# Patient Record
Sex: Male | Born: 1950 | Race: White | Hispanic: No | Smoking: Former smoker
Health system: Southern US, Community
[De-identification: ages and names within clinical notes are randomized; demographics above are authoritative.]

## PROBLEM LIST (undated history)

## (undated) DIAGNOSIS — I10 Essential (primary) hypertension: Secondary | ICD-10-CM

## (undated) DIAGNOSIS — J449 Chronic obstructive pulmonary disease, unspecified: Secondary | ICD-10-CM

## (undated) DIAGNOSIS — I2510 Atherosclerotic heart disease of native coronary artery without angina pectoris: Secondary | ICD-10-CM

## (undated) DIAGNOSIS — J45909 Unspecified asthma, uncomplicated: Secondary | ICD-10-CM

---

## 2017-07-13 ENCOUNTER — Encounter: Payer: Self-pay | Admitting: Emergency Medicine

## 2017-07-13 ENCOUNTER — Emergency Department: Payer: Medicare Other

## 2017-07-13 ENCOUNTER — Inpatient Hospital Stay
Admission: EM | Admit: 2017-07-13 | Discharge: 2017-07-16 | DRG: 194 | Disposition: A | Discharge status: Home / Self Care | Payer: Medicare Other | Attending: Internal Medicine | Admitting: Internal Medicine

## 2017-07-13 ENCOUNTER — Other Ambulatory Visit: Payer: Self-pay

## 2017-07-13 DIAGNOSIS — I2510 Atherosclerotic heart disease of native coronary artery without angina pectoris: Secondary | ICD-10-CM | POA: Diagnosis present

## 2017-07-13 DIAGNOSIS — R0602 Shortness of breath: Secondary | ICD-10-CM

## 2017-07-13 DIAGNOSIS — Z9981 Dependence on supplemental oxygen: Secondary | ICD-10-CM | POA: Diagnosis not present

## 2017-07-13 DIAGNOSIS — Z79899 Other long term (current) drug therapy: Secondary | ICD-10-CM | POA: Diagnosis not present

## 2017-07-13 DIAGNOSIS — R0603 Acute respiratory distress: Secondary | ICD-10-CM

## 2017-07-13 DIAGNOSIS — Z7982 Long term (current) use of aspirin: Secondary | ICD-10-CM

## 2017-07-13 DIAGNOSIS — Z87891 Personal history of nicotine dependence: Secondary | ICD-10-CM | POA: Diagnosis not present

## 2017-07-13 DIAGNOSIS — J441 Chronic obstructive pulmonary disease with (acute) exacerbation: Secondary | ICD-10-CM | POA: Diagnosis not present

## 2017-07-13 DIAGNOSIS — E782 Mixed hyperlipidemia: Secondary | ICD-10-CM | POA: Diagnosis present

## 2017-07-13 DIAGNOSIS — J189 Pneumonia, unspecified organism: Secondary | ICD-10-CM

## 2017-07-13 DIAGNOSIS — I252 Old myocardial infarction: Secondary | ICD-10-CM | POA: Diagnosis not present

## 2017-07-13 DIAGNOSIS — R0789 Other chest pain: Secondary | ICD-10-CM

## 2017-07-13 DIAGNOSIS — J101 Influenza due to other identified influenza virus with other respiratory manifestations: Secondary | ICD-10-CM | POA: Diagnosis present

## 2017-07-13 DIAGNOSIS — J111 Influenza due to unidentified influenza virus with other respiratory manifestations: Secondary | ICD-10-CM | POA: Diagnosis not present

## 2017-07-13 DIAGNOSIS — I10 Essential (primary) hypertension: Secondary | ICD-10-CM | POA: Diagnosis present

## 2017-07-13 DIAGNOSIS — Z880 Allergy status to penicillin: Secondary | ICD-10-CM | POA: Diagnosis not present

## 2017-07-13 DIAGNOSIS — Z9861 Coronary angioplasty status: Secondary | ICD-10-CM | POA: Diagnosis not present

## 2017-07-13 DIAGNOSIS — J181 Lobar pneumonia, unspecified organism: Secondary | ICD-10-CM

## 2017-07-13 HISTORY — DX: Essential (primary) hypertension: I10

## 2017-07-13 HISTORY — DX: Unspecified asthma, uncomplicated: J45.909

## 2017-07-13 HISTORY — DX: Chronic obstructive pulmonary disease, unspecified: J44.9

## 2017-07-13 HISTORY — DX: Atherosclerotic heart disease of native coronary artery without angina pectoris: I25.10

## 2017-07-13 LAB — COMPREHENSIVE METABOLIC PANEL
ALBUMIN: 4.2 g/dL (ref 3.5–5.0)
ALT: 28 U/L (ref 17–63)
AST: 32 U/L (ref 15–41)
Alkaline Phosphatase: 89 U/L (ref 38–126)
Anion gap: 12 (ref 5–15)
BUN: 13 mg/dL (ref 6–20)
CHLORIDE: 105 mmol/L (ref 101–111)
CO2: 21 mmol/L — AB (ref 22–32)
CREATININE: 0.92 mg/dL (ref 0.61–1.24)
Calcium: 10.1 mg/dL (ref 8.9–10.3)
GFR calc Af Amer: >60 mL/min (ref >60)
GFR calc non Af Amer: >60 mL/min (ref >60)
GLUCOSE: 121 mg/dL — AB (ref 65–99)
Potassium: 3.9 mmol/L (ref 3.5–5.1)
SODIUM: 138 mmol/L (ref 135–145)
Total Bilirubin: 0.9 mg/dL (ref 0.3–1.2)
Total Protein: 7.4 g/dL (ref 6.5–8.1)

## 2017-07-13 LAB — CBC WITH DIFFERENTIAL/PLATELET
Basophils Absolute: 0.1 10*3/uL (ref 0–0.1)
Basophils Relative: 1 %
Eosinophils Absolute: 0 10*3/uL (ref 0–0.7)
Eosinophils Relative: 0 %
HEMATOCRIT: 40.1 % (ref 40.0–52.0)
HEMOGLOBIN: 12.9 g/dL — AB (ref 13.0–18.0)
LYMPHS PCT: 22 %
Lymphs Abs: 3.2 10*3/uL (ref 1.0–3.6)
MCH: 25.2 pg — ABNORMAL LOW (ref 26.0–34.0)
MCHC: 32.1 g/dL (ref 32.0–36.0)
MCV: 78.6 fL — AB (ref 80.0–100.0)
MONO ABS: 0.7 10*3/uL (ref 0.2–1.0)
MONOS PCT: 5 %
NEUTROS ABS: 10.4 10*3/uL — AB (ref 1.4–6.5)
NEUTROS PCT: 72 %
Platelets: 285 10*3/uL (ref 150–440)
RBC: 5.1 MIL/uL (ref 4.40–5.90)
RDW: 17.7 % — AB (ref 11.5–14.5)
WBC: 14.4 10*3/uL — ABNORMAL HIGH (ref 3.8–10.6)

## 2017-07-13 LAB — TROPONIN I
Troponin I: <0.03 ng/mL (ref <0.03)
Troponin I: <0.03 ng/mL (ref <0.03)

## 2017-07-13 LAB — FIBRIN DERIVATIVES D-DIMER (ARMC ONLY): FIBRIN DERIVATIVES D-DIMER (ARMC): 704.42 ng{FEU}/mL — AB (ref 0.00–499.00)

## 2017-07-13 LAB — INFLUENZA PANEL BY PCR (TYPE A & B)
Influenza A By PCR: POSITIVE — AB
Influenza B By PCR: NEGATIVE

## 2017-07-13 LAB — BRAIN NATRIURETIC PEPTIDE: B Natriuretic Peptide: 79 pg/mL (ref 0.0–100.0)

## 2017-07-13 MED ORDER — DOCUSATE SODIUM 100 MG PO CAPS
100.0000 mg | ORAL_CAPSULE | Freq: Two times a day (BID) | ORAL | Status: DC
  Administered 2017-07-14 – 2017-07-16 (×5): 100 mg via ORAL
  Filled 2017-07-13 (×6): qty 1

## 2017-07-13 MED ORDER — ONDANSETRON HCL 4 MG PO TABS: 4.0000 mg | ORAL_TABLET | Freq: Four times a day (QID) | ORAL | Status: DC | PRN

## 2017-07-13 MED ORDER — METHYLPREDNISOLONE SODIUM SUCC 125 MG IJ SOLR
125.0000 mg | Freq: Once | INTRAMUSCULAR | Status: AC
  Administered 2017-07-13: 125 mg via INTRAVENOUS
  Filled 2017-07-13: qty 2

## 2017-07-13 MED ORDER — OSELTAMIVIR PHOSPHATE 75 MG PO CAPS
75.0000 mg | ORAL_CAPSULE | Freq: Once | ORAL | Status: AC
  Administered 2017-07-13: 75 mg via ORAL
  Filled 2017-07-13: qty 1

## 2017-07-13 MED ORDER — DILTIAZEM HCL 30 MG PO TABS
30.0000 mg | ORAL_TABLET | Freq: Two times a day (BID) | ORAL | Status: DC
  Administered 2017-07-13 – 2017-07-16 (×6): 30 mg via ORAL
  Filled 2017-07-13 (×6): qty 1

## 2017-07-13 MED ORDER — IOPAMIDOL (ISOVUE-370) INJECTION 76%
75.0000 mL | Freq: Once | INTRAVENOUS | Status: AC | PRN
Start: 2017-07-13 — End: 2017-07-13
  Administered 2017-07-13: 75 mL via INTRAVENOUS

## 2017-07-13 MED ORDER — METHYLPREDNISOLONE SODIUM SUCC 125 MG IJ SOLR
60.0000 mg | Freq: Four times a day (QID) | INTRAMUSCULAR | Status: DC
  Administered 2017-07-13 – 2017-07-14 (×4): 60 mg via INTRAVENOUS
  Filled 2017-07-13 (×4): qty 2

## 2017-07-13 MED ORDER — BISACODYL 10 MG RE SUPP: 10.0000 mg | Freq: Every day | RECTAL | Status: DC | PRN

## 2017-07-13 MED ORDER — ACETAMINOPHEN 325 MG PO TABS: 650.0000 mg | ORAL_TABLET | Freq: Four times a day (QID) | ORAL | Status: DC | PRN

## 2017-07-13 MED ORDER — ONDANSETRON HCL 4 MG/2ML IJ SOLN: 4.0000 mg | Freq: Four times a day (QID) | INTRAMUSCULAR | Status: DC | PRN

## 2017-07-13 MED ORDER — PANTOPRAZOLE SODIUM 40 MG PO TBEC
40.0000 mg | DELAYED_RELEASE_TABLET | Freq: Every day | ORAL | Status: DC
  Administered 2017-07-13 – 2017-07-16 (×4): 40 mg via ORAL
  Filled 2017-07-13 (×4): qty 1

## 2017-07-13 MED ORDER — IPRATROPIUM-ALBUTEROL 0.5-2.5 (3) MG/3ML IN SOLN
3.0000 mL | Freq: Once | RESPIRATORY_TRACT | Status: AC
  Administered 2017-07-13: 3 mL via RESPIRATORY_TRACT
  Filled 2017-07-13: qty 3

## 2017-07-13 MED ORDER — ISOSORBIDE MONONITRATE ER 30 MG PO TB24
30.0000 mg | ORAL_TABLET | Freq: Every day | ORAL | Status: DC
  Administered 2017-07-13 – 2017-07-16 (×4): 30 mg via ORAL
  Filled 2017-07-13 (×4): qty 1

## 2017-07-13 MED ORDER — LEVOFLOXACIN IN D5W 750 MG/150ML IV SOLN
750.0000 mg | INTRAVENOUS | Status: DC
  Administered 2017-07-14: 750 mg via INTRAVENOUS
  Filled 2017-07-13: qty 150

## 2017-07-13 MED ORDER — NITROGLYCERIN 0.4 MG SL SUBL
0.4000 mg | SUBLINGUAL_TABLET | SUBLINGUAL | Status: DC | PRN
  Administered 2017-07-14 – 2017-07-15 (×3): 0.4 mg via SUBLINGUAL
  Filled 2017-07-13 (×4): qty 1

## 2017-07-13 MED ORDER — ASPIRIN EC 325 MG PO TBEC
325.0000 mg | DELAYED_RELEASE_TABLET | Freq: Every day | ORAL | Status: DC
  Administered 2017-07-13 – 2017-07-16 (×4): 325 mg via ORAL
  Filled 2017-07-13 (×4): qty 1

## 2017-07-13 MED ORDER — LISINOPRIL 5 MG PO TABS
2.5000 mg | ORAL_TABLET | Freq: Every day | ORAL | Status: DC
  Administered 2017-07-13 – 2017-07-14 (×2): 2.5 mg via ORAL
  Filled 2017-07-13 (×2): qty 0.5

## 2017-07-13 MED ORDER — LEVOFLOXACIN IN D5W 750 MG/150ML IV SOLN
750.0000 mg | Freq: Once | INTRAVENOUS | Status: AC
  Administered 2017-07-13: 750 mg via INTRAVENOUS
  Filled 2017-07-13: qty 150

## 2017-07-13 MED ORDER — ACETAMINOPHEN 650 MG RE SUPP: 650.0000 mg | Freq: Four times a day (QID) | RECTAL | Status: DC | PRN

## 2017-07-13 MED ORDER — SODIUM CHLORIDE 0.9 % IV SOLN
INTRAVENOUS | Status: DC
  Administered 2017-07-13 – 2017-07-15 (×6): via INTRAVENOUS

## 2017-07-13 MED ORDER — METHYLPREDNISOLONE SODIUM SUCC 125 MG IJ SOLR: 125.0000 mg | Freq: Once | INTRAMUSCULAR | Status: DC

## 2017-07-13 MED ORDER — ATORVASTATIN CALCIUM 10 MG PO TABS
10.0000 mg | ORAL_TABLET | Freq: Every day | ORAL | Status: DC
  Administered 2017-07-13 – 2017-07-16 (×4): 10 mg via ORAL
  Filled 2017-07-13 (×4): qty 1

## 2017-07-13 MED ORDER — IPRATROPIUM-ALBUTEROL 0.5-2.5 (3) MG/3ML IN SOLN
3.0000 mL | Freq: Four times a day (QID) | RESPIRATORY_TRACT | Status: DC
  Administered 2017-07-13 – 2017-07-14 (×6): 3 mL via RESPIRATORY_TRACT
  Filled 2017-07-13 (×5): qty 3

## 2017-07-13 MED ORDER — IPRATROPIUM-ALBUTEROL 0.5-2.5 (3) MG/3ML IN SOLN
3.0000 mL | Freq: Once | RESPIRATORY_TRACT | Status: AC
Start: 2017-07-13 — End: 2017-07-13
  Administered 2017-07-13: 3 mL via RESPIRATORY_TRACT
  Filled 2017-07-13: qty 3

## 2017-07-13 MED ORDER — OSELTAMIVIR PHOSPHATE 75 MG PO CAPS
75.0000 mg | ORAL_CAPSULE | Freq: Two times a day (BID) | ORAL | Status: DC
  Administered 2017-07-13 – 2017-07-16 (×7): 75 mg via ORAL
  Filled 2017-07-13 (×7): qty 1

## 2017-07-13 MED ORDER — ENOXAPARIN SODIUM 40 MG/0.4ML ~~LOC~~ SOLN
40.0000 mg | SUBCUTANEOUS | Status: DC
  Administered 2017-07-13 – 2017-07-15 (×3): 40 mg via SUBCUTANEOUS
  Filled 2017-07-13 (×3): qty 0.4

## 2017-07-13 NOTE — H&P (Signed)
History and Physical    Rosita KeaDale Brag ZOX:[id number] DOB: 02/18/1951 DOA: 07/13/2017  Referring physician: Dr. Darnelle CatalanMalinda PCP: System, Pcp Not In  Specialists: none  Chief Complaint: SOB and chest pain  HPI: Rosita KeaDale Brag is a 67 y.o. male has a past medical history significant for HTN, COPD, and CAD with nocturnal O2 at home now with left-sided chest and rib pain with cough and SOB. Influenza A positive in ER. Chest CT negative for pneumonia or PE. Hypoxic on RA despite IV steroids and SVN's x 3. Still with left-sided CP. He is now admitted. Has had fevers at home. No N/V/D.  Review of Systems: The patient denies anorexia, weight loss,, vision loss, decreased hearing, hoarseness, syncope,  peripheral edema, balance deficits, hemoptysis, abdominal pain, melena, hematochezia, severe indigestion/heartburn, hematuria, incontinence, genital sores, muscle weakness, suspicious skin lesions, transient blindness, difficulty walking, depression, unusual weight change, abnormal bleeding, enlarged lymph nodes, angioedema, and breast masses.   Past Medical History:  Diagnosis Date  . Asthma   . COPD (chronic obstructive pulmonary disease) (HCC)   . Coronary artery disease   . Hypertension    History reviewed. No pertinent surgical history. Social History:  reports that he quit smoking about 9 years ago. he has never used smokeless tobacco. He reports that he does not drink alcohol or use drugs.  Allergies  Allergen Reactions  . Penicillins Hives    Has patient had a PCN reaction causing immediate rash, facial/tongue/throat swelling, SOB or lightheadedness with hypotension: Unknown Has patient had a PCN reaction causing severe rash involving mucus membranes or skin necrosis: Unknown Has patient had a PCN reaction that required hospitalization: Unknown Has patient had a PCN reaction occurring within the last 10 years: Yes If all of the above answers are "NO", then may proceed with Cephalosporin use.     History reviewed. No pertinent family history.  Prior to Admission medications   Medication Sig Start Date End Date Taking? Authorizing Provider  aspirin 325 MG tablet Take 325 mg by mouth daily.   Yes [provider]  atorvastatin (LIPITOR) 10 MG tablet Take 10 mg by mouth daily.   Yes [provider]  diltiazem (CARDIZEM) 30 MG tablet Take 30 mg by mouth 2 (two) times daily.   Yes [provider]  isosorbide mononitrate (IMDUR) 30 MG 24 hr tablet Take 30 mg by mouth daily.   Yes [provider]  lisinopril (PRINIVIL,ZESTRIL) 2.5 MG tablet Take 2.5 mg by mouth daily.   Yes [provider]  nitroGLYCERIN (NITROSTAT) 0.4 MG SL tablet Place 0.4 mg under the tongue every 5 (five) minutes as needed for chest pain.   Yes [provider]  pantoprazole (PROTONIX) 40 MG tablet Take 40 mg by mouth daily.   Yes [provider]   Physical Exam: Vitals:   07/13/17 1130 07/13/17 1200 07/13/17 1230 07/13/17 1300  BP: 128/73 131/76 139/78 (!) 146/94  Pulse: 82 85 80 77  Resp: 20 (!) 23 19 15   Temp:      TempSrc:      SpO2: 96% 97% 99% 98%  Weight:      Height:         General:  No apparent distress, WDWN, Barrett/AT  Eyes: PERRL, EOMI, no scleral icterus, conjunctiva clear  ENT: moist oropharynx without exudate, TM's benign, dentition fair  Neck: supple, no lymphadenopathy. No bruits or thyromegaly  Cardiovascular: regular rate without MRG; 2+ peripheral pulses, no JVD, no peripheral edema  Respiratory: diffuse rhonchi  without wheezes or rales. No dullness. Respiratory effort increased  Abdomen: soft, non tender to palpation, positive bowel sounds, no guarding, no rebound  Skin: no rashes or lesions  Musculoskeletal: normal bulk and tone, no joint swelling  Psychiatric: normal mood and affect, A&OX3  Neurologic: CN 2-12 grossly intact, Motor strength 5/5 in all 4 groups with symmetric DTR's and non-focal sensory  exam  Labs on Admission:  Basic Metabolic Panel: Recent Labs  Lab 07/13/17 0859  NA 138  K 3.9  CL 105  CO2 21*  GLUCOSE 121*  BUN 13  CREATININE 0.92  CALCIUM 10.1   Liver Function Tests: Recent Labs  Lab 07/13/17 0859  AST 32  ALT 28  ALKPHOS 89  BILITOT 0.9  PROT 7.4  ALBUMIN 4.2   No results for input(s): LIPASE, AMYLASE in the last 168 hours. No results for input(s): AMMONIA in the last 168 hours. CBC: Recent Labs  Lab 07/13/17 0859  WBC 14.4*  NEUTROABS 10.4*  HGB 12.9*  HCT 40.1  MCV 78.6*  PLT 285   Cardiac Enzymes: Recent Labs  Lab 07/13/17 0859  TROPONINI <0.03    BNP (last 3 results) Recent Labs    07/13/17 0859  BNP 79.0    ProBNP (last 3 results) No results for input(s): PROBNP in the last 8760 hours.  CBG: No results for input(s): GLUCAP in the last 168 hours.  Radiological Exams on Admission: Ct Angio Chest Pe W And/or Wo Contrast  Result Date: 07/13/2017 CLINICAL DATA:  Left-sided chest pain and shortness of breath. EXAM: CT ANGIOGRAPHY CHEST WITH CONTRAST TECHNIQUE: Multidetector CT imaging of the chest was performed using the standard protocol during bolus administration of intravenous contrast. Multiplanar CT image reconstructions and MIPs were obtained to evaluate the vascular anatomy. CONTRAST:  75mL ISOVUE-370 IOPAMIDOL (ISOVUE-370) INJECTION 76% COMPARISON:  07/13/2017 FINDINGS: Cardiovascular: The heart size appears normal. Aortic atherosclerosis. Calcification within the LAD and left circumflex coronary arteries noted. No pericardial effusion. The main pulmonary artery appears patent. No saddle embolus or central obstructing emboli identified. No lobar or segmental pulmonary artery filling defects identified. Mediastinum/Nodes: Normal appearance of the thyroid gland. The trachea appears patent and is midline. Normal appearance of the esophagus. No axillary or supraclavicular adenopathy. Mild mediastinal adenopathy and hilar  adenopathy identified. AP window lymph node measures 11 mm, image 41 of series 4. Subcarinal lymph node measures 1.4 cm. Left hilar lymph node measures 0.9 cm. Lungs/Pleura: No pleural effusion identified. Advanced changes of emphysema identified. Mild pneumonitis within the posterior and medial left lower lobe. No airspace consolidation or atelectasis identified. No pneumothorax. Within the left upper lobe there is a small nodule with spiculated margins measuring 8 mm, image 39 of series 9. Left lung perifissural nodule likely represents an intrapulmonary lymph node Upper Abdomen: No acute abnormality. Small stone identified within the dependent portion of the gallbladder. Musculoskeletal: No aggressive lytic or sclerotic bone lesions. Review of the MIP images confirms the above findings. IMPRESSION: 1. No evidence for acute pulmonary embolus. 2. Left upper lobe pulmonary nodule measures 8 mm. Non-contrast chest CT at 6-12 months is recommended. If the nodule is stable at time of repeat CT, then future CT at 18-24 months (from today's scan) is considered optional for low-risk patients, but is recommended for high-risk patients. This recommendation follows the consensus statement: Guidelines for Management of Incidental Pulmonary Nodules Detected on CT Images:From the Fleischner Society 2017; published online before print (10.1148/[id number]). 3. Mild mediastinal and hilar adenopathy identified. 4. Aortic  Atherosclerosis (ICD10-I70.0) and Emphysema (ICD10-J43.9). 5. Two vessel coronary artery atherosclerotic calcifications. Electronically Signed   By: Signa Kell M.D.   On: 07/13/2017 12:10   Dg Chest Portable 1 View  Result Date: 07/13/2017 CLINICAL DATA:  Chest pain and cough EXAM: PORTABLE CHEST 1 VIEW COMPARISON:  None. FINDINGS: 0919 hours. The lungs are clear without focal pneumonia, edema, pneumothorax or pleural effusion. Small pulmonary nodule identified left upper lobe. Interstitial markings  are diffusely coarsened with chronic features. The cardio pericardial silhouette is enlarged. The visualized bony structures of the thorax are intact. Telemetry leads overlie the chest. IMPRESSION: 1. Left upper lobe pulmonary nodule. CT chest without contrast recommended to further evaluate. 2. Cardiomegaly with chronic interstitial changes. Electronically Signed   By: Kennith Center M.D.   On: 07/13/2017 09:35    EKG: Independently reviewed.  Assessment/Plan Principal Problem:   COPD exacerbation (HCC) Active Problems:   Influenza A   Acute respiratory distress   Chest pain, atypical   Will admit to floor with IV fluids, IV ABX, Tamiflu, O2, and SVN's. Echo ordered. Follow enzymes. Consult Pulmonology and Cardiology. Repeat labs in AM. CM consult for D/c planning  Diet: low salt Fluids: NS@75  DVT Prophylaxis: Lovenox  Code Status: FULL  Family Communication: none  Disposition Plan: home  Time spent: 50 min

## 2017-07-13 NOTE — ED Notes (Signed)
This tech walked pt in front of nurses station 25 ft pt o2 sat 95 % on 2 lts of O2; pt began to wheeze and needed a stop break; heart rate did raise from 88 bpm to 102 bpm the more he walked

## 2017-07-13 NOTE — ED Provider Notes (Addendum)
[facility] Emergency Department Provider Note   ____________________________________________   First MD Initiated Contact with Patient 07/13/17 [phone]     (approximate)  I have reviewed the triage vital signs and the nursing notes.   HISTORY  Chief Complaint Chest Pain; Shortness of Breath; and Cough    HPI Stephen Brady is a 67 y.o. male Patient reports 2 days of cough productive of white or black phlegm, shortness of breath, aching all over and some chest pain especially in the left lower chest degrees. He has a fever 100.6. He felt hot at home as well. Has a history of COPD.  Past Medical History:  Diagnosis Date  . Asthma   . COPD (chronic obstructive pulmonary disease) (HCC)   . Coronary artery disease   . Hypertension     There are no active problems to display for this patient.   History reviewed. No pertinent surgical history.  Prior to Admission medications   Medication Sig Start Date End Date Taking? Authorizing Provider  aspirin 325 MG tablet Take 325 mg by mouth daily.   Yes [provider]  atorvastatin (LIPITOR) 10 MG tablet Take 10 mg by mouth daily.   Yes [provider]  diltiazem (CARDIZEM) 30 MG tablet Take 30 mg by mouth 2 (two) times daily.   Yes [provider]  isosorbide mononitrate (IMDUR) 30 MG 24 hr tablet Take 30 mg by mouth daily.   Yes [provider]  lisinopril (PRINIVIL,ZESTRIL) 2.5 MG tablet Take 2.5 mg by mouth daily.   Yes [provider]  nitroGLYCERIN (NITROSTAT) 0.4 MG SL tablet Place 0.4 mg under the tongue every 5 (five) minutes as needed for chest pain.   Yes [provider]  pantoprazole (PROTONIX) 40 MG tablet Take 40 mg by mouth daily.   Yes [provider]    Allergies Penicillins  History reviewed. No pertinent family history.  Social History Social History   Tobacco Use  . Smoking status: Former Smoker    Last attempt to quit:  07/13/2008    Years since quitting: 9.0  . Smokeless tobacco: Never Used  Substance Use Topics  . Alcohol use: No    Frequency: Never  . Drug use: No    Review of Systems  Constitutional:  fever/chills Eyes: No visual changes. ENT: No sore throat. Cardiovascular: see history of present illness Respiratory:see history of present illness Gastrointestinal: No abdominal pain.  No nausea, no vomiting.  No diarrhea.  No constipation. Genitourinary: Negative for dysuria. Musculoskeletal: Negative for back pain. Skin: Negative for rash. Neurological: Negative for headaches, focal weakness  ____________________________________________   PHYSICAL EXAM:  VITAL SIGNS: ED Triage Vitals  Enc Vitals Group     BP 07/13/17 0852 (!) 174/84     Pulse Rate 07/13/17 0852 (!) 106     Resp 07/13/17 0852 (!) 24     Temp 07/13/17 0852 (!) 100.6 F (38.1 C)     Temp Source 07/13/17 0852 Oral     SpO2 07/13/17 0843 91 %     Weight 07/13/17 0853 147 lb (66.7 kg)     Height 07/13/17 0853 5\' 7"  (1.702 m)     Head Circumference --      Peak Flow --      Pain Score 07/13/17 0853 8     Pain Loc --      Pain Edu? --      Excl. in GC? --     Constitutional: Alert and  oriented. Well appearing and in no acute distress. Eyes: Conjunctivae are normal.  Head: Atraumatic. Nose: No congestion/rhinnorhea. Mouth/Throat: Mucous membranes are moist.  Oropharynx non-erythematous. Neck: No stridor Cardiovascular: Normal rate, regular rhythm. Grossly normal heart sounds.  Good peripheral circulation. Respiratory: increased respiratory effort.  No retractions. Lungs wheezing and tight Gastrointestinal: Soft and nontender. No distention. No abdominal bruits. No CVA tenderness. Musculoskeletal: No lower extremity tenderness nor edema.  No joint effusions. Neurologic:  Normal speech and language. No gross focal neurologic deficits are appreciated. No gait instability. Skin:  Skin is warm, dry and intact. No rash  noted. Psychiatric: Mood and affect are normal. Speech and behavior are normal.  ____________________________________________   LABS (all labs ordered are listed, but only abnormal results are displayed)  Labs Reviewed  COMPREHENSIVE METABOLIC PANEL - Abnormal; Notable for the following components:      Result Value   CO2 21 (*)    Glucose, Bld 121 (*)    All other components within normal limits  CBC WITH DIFFERENTIAL/PLATELET - Abnormal; Notable for the following components:   WBC 14.4 (*)    Hemoglobin 12.9 (*)    MCV 78.6 (*)    MCH 25.2 (*)    RDW 17.7 (*)    Neutro Abs 10.4 (*)    All other components within normal limits  INFLUENZA PANEL BY PCR (TYPE A & B) - Abnormal; Notable for the following components:   Influenza A By PCR POSITIVE (*)    All other components within normal limits  FIBRIN DERIVATIVES D-DIMER ([facility] ONLY) - Abnormal; Notable for the following components:   Fibrin derivatives D-dimer ([facility]) 704.42 (*)    All other components within normal limits  CULTURE, BLOOD (ROUTINE X 2)  CULTURE, BLOOD (ROUTINE X 2)  TROPONIN I  BRAIN NATRIURETIC PEPTIDE   ____________________________________________  EKG  EKG read and interpreted by me shows sinus tachycardia rate 109 extreme right axis no acute ST-T wave changes ____________________________________________  RADIOLOGY  ED MD interpretation:   Official radiology report(s): Ct Angio Chest Pe W And/or Wo Contrast  Result Date: 07/13/2017 CLINICAL DATA:  Left-sided chest pain and shortness of breath. EXAM: CT ANGIOGRAPHY CHEST WITH CONTRAST TECHNIQUE: Multidetector CT imaging of the chest was performed using the standard protocol during bolus administration of intravenous contrast. Multiplanar CT image reconstructions and MIPs were obtained to evaluate the vascular anatomy. CONTRAST:  75mL ISOVUE-370 IOPAMIDOL (ISOVUE-370) INJECTION 76% COMPARISON:  07/13/2017 FINDINGS: Cardiovascular: The heart size appears  normal. Aortic atherosclerosis. Calcification within the LAD and left circumflex coronary arteries noted. No pericardial effusion. The main pulmonary artery appears patent. No saddle embolus or central obstructing emboli identified. No lobar or segmental pulmonary artery filling defects identified. Mediastinum/Nodes: Normal appearance of the thyroid gland. The trachea appears patent and is midline. Normal appearance of the esophagus. No axillary or supraclavicular adenopathy. Mild mediastinal adenopathy and hilar adenopathy identified. AP window lymph node measures 11 mm, image 41 of series 4. Subcarinal lymph node measures 1.4 cm. Left hilar lymph node measures 0.9 cm. Lungs/Pleura: No pleural effusion identified. Advanced changes of emphysema identified. Mild pneumonitis within the posterior and medial left lower lobe. No airspace consolidation or atelectasis identified. No pneumothorax. Within the left upper lobe there is a small nodule with spiculated margins measuring 8 mm, image 39 of series 9. Left lung perifissural nodule likely represents an intrapulmonary lymph node Upper Abdomen: No acute abnormality. Small stone identified within the dependent portion of the gallbladder. Musculoskeletal: No aggressive lytic or  sclerotic bone lesions. Review of the MIP images confirms the above findings. IMPRESSION: 1. No evidence for acute pulmonary embolus. 2. Left upper lobe pulmonary nodule measures 8 mm. Non-contrast chest CT at 6-12 months is recommended. If the nodule is stable at time of repeat CT, then future CT at 18-24 months (from today's scan) is considered optional for low-risk patients, but is recommended for high-risk patients. This recommendation follows the consensus statement: Guidelines for Management of Incidental Pulmonary Nodules Detected on CT Images:From the Fleischner Society 2017; published online before print (10.1148/[id number]). 3. Mild mediastinal and hilar adenopathy identified. 4.  Aortic Atherosclerosis (ICD10-I70.0) and Emphysema (ICD10-J43.9). 5. Two vessel coronary artery atherosclerotic calcifications. Electronically Signed   By: Signa Kellaylor  Stroud M.D.   On: 07/13/2017 12:10   Dg Chest Portable 1 View  Result Date: 07/13/2017 CLINICAL DATA:  Chest pain and cough EXAM: PORTABLE CHEST 1 VIEW COMPARISON:  None. FINDINGS: 0919 hours. The lungs are clear without focal pneumonia, edema, pneumothorax or pleural effusion. Small pulmonary nodule identified left upper lobe. Interstitial markings are diffusely coarsened with chronic features. The cardio pericardial silhouette is enlarged. The visualized bony structures of the thorax are intact. Telemetry leads overlie the chest. IMPRESSION: 1. Left upper lobe pulmonary nodule. CT chest without contrast recommended to further evaluate. 2. Cardiomegaly with chronic interstitial changes. Electronically Signed   By: Kennith CenterEric  Mansell M.D.   On: 07/13/2017 09:35    ____________________________________________   PROCEDURES  Procedure(s) performed:   Procedures  Critical Care performed:   ____________________________________________   INITIAL IMPRESSION / ASSESSMENT AND PLAN / ED COURSE  patient's O2 sat falls to 86. He has the flu and some pneumonitis in 2 segments of his left lower lobe and COPD in his wheezing like crazy afterSolu-Medrol and 4 breathing treatments. 2 albuterol and 2 DuoNeb         ____________________________________________   FINAL CLINICAL IMPRESSION(S) / ED DIAGNOSES  Final diagnoses:  Community acquired pneumonia of left lower lobe of lung (HCC)  COPD exacerbation (HCC)  Shortness of breath  Influenza     ED Discharge Orders    None       Note:  This document was prepared using Dragon voice recognition software and may include unintentional dictation errors.    Arnaldo NatalMalinda, Jacie Tristan F, MD 07/13/17 1437    Arnaldo NatalMalinda, Brittish Bolinger F, MD 07/13/17 [id number]

## 2017-07-13 NOTE — Progress Notes (Signed)
Pharmacy Antibiotic Note  Rosita KeaDale Brag is a 67 y.o. male admitted on 07/13/2017 with pneumonia.  Pharmacy has been consulted for levofloxacin dosing.  Plan: Levofloxacin 750mg  every 24 hours   Follow up with EKG ordered to check QTc interval   Height: 5\' 7"  (170.2 cm) Weight: 147 lb (66.7 kg) IBW/kg (Calculated) : 66.1  Temp (24hrs), Avg:100.6 F (38.1 C), Min:100.6 F (38.1 C), Max:100.6 F (38.1 C)  Recent Labs  Lab 07/13/17 0859  WBC 14.4*  CREATININE 0.92    Estimated Creatinine Clearance: 71.8 mL/min (by C-G formula based on SCr of 0.92 mg/dL).    Allergies  Allergen Reactions  . Penicillins Hives    Has patient had a PCN reaction causing immediate rash, facial/tongue/throat swelling, SOB or lightheadedness with hypotension: Unknown Has patient had a PCN reaction causing severe rash involving mucus membranes or skin necrosis: Unknown Has patient had a PCN reaction that required hospitalization: Unknown Has patient had a PCN reaction occurring within the last 10 years: Yes If all of the above answers are "NO", then may proceed with Cephalosporin use.    Antimicrobials this admission: 2/16 Levofloxacin >>  2/16 Oseltamivir >>  Dose adjustments this admission:   Microbiology results: 2/16 BCx: sent    Thank you for allowing pharmacy to be a part of this patient's care.  Cleopatra CedarStephanie Nicloe Frontera, PharmD  Pharmacy Resident  07/13/2017 3:48 PM

## 2017-07-13 NOTE — ED Notes (Signed)
To CT and returned, reports feeling some better but states breathing is still "hard"

## 2017-07-13 NOTE — ED Notes (Signed)
Pt reports feeling some better with 2nd breathing treatment.

## 2017-07-13 NOTE — ED Triage Notes (Signed)
Pt arrived via EMS c/o two day history of chest pain, cough and shortness of breath, EMS had an initial O2 sat of 91%, they gave double albuterol treatment en route, on arrival here he states some relief with the breathing treatment

## 2017-07-13 NOTE — ED Notes (Signed)
On 2L of O2 via [place] at rest pt is at  99% oxygen saturation  On RA at rest pt is at 95% oxygen saturation  On RA upon ambulation pt dropped to 86% oxygen saturation  On 2L of O2 via [place] upon ambulation pt's oxygen saturation increased to 95%  When returned to rest on 2L of O2 via [place] pt increased to 98% oxygen saturation.

## 2017-07-14 ENCOUNTER — Inpatient Hospital Stay
Admit: 2017-07-14 | Discharge: 2017-07-14 | Disposition: A | Discharge status: Home / Self Care | Payer: Medicare Other | Attending: Internal Medicine | Admitting: Internal Medicine

## 2017-07-14 DIAGNOSIS — J441 Chronic obstructive pulmonary disease with (acute) exacerbation: Secondary | ICD-10-CM

## 2017-07-14 DIAGNOSIS — R0789 Other chest pain: Secondary | ICD-10-CM

## 2017-07-14 DIAGNOSIS — J111 Influenza due to unidentified influenza virus with other respiratory manifestations: Secondary | ICD-10-CM

## 2017-07-14 DIAGNOSIS — R0603 Acute respiratory distress: Secondary | ICD-10-CM

## 2017-07-14 LAB — COMPREHENSIVE METABOLIC PANEL
ALBUMIN: 3.2 g/dL — AB (ref 3.5–5.0)
ALT: 20 U/L (ref 17–63)
ANION GAP: 5 (ref 5–15)
AST: 20 U/L (ref 15–41)
Alkaline Phosphatase: 57 U/L (ref 38–126)
BUN: 21 mg/dL — ABNORMAL HIGH (ref 6–20)
CO2: 22 mmol/L (ref 22–32)
Calcium: 8.9 mg/dL (ref 8.9–10.3)
Chloride: 110 mmol/L (ref 101–111)
Creatinine, Ser: 0.84 mg/dL (ref 0.61–1.24)
GFR calc Af Amer: >60 mL/min (ref >60)
Glucose, Bld: 161 mg/dL — ABNORMAL HIGH (ref 65–99)
POTASSIUM: 4 mmol/L (ref 3.5–5.1)
Sodium: 137 mmol/L (ref 135–145)
Total Bilirubin: 0.5 mg/dL (ref 0.3–1.2)
Total Protein: 5.6 g/dL — ABNORMAL LOW (ref 6.5–8.1)

## 2017-07-14 LAB — CBC
HEMATOCRIT: 30.7 % — AB (ref 40.0–52.0)
HEMOGLOBIN: 10.3 g/dL — AB (ref 13.0–18.0)
MCH: 26.3 pg (ref 26.0–34.0)
MCHC: 33.7 g/dL (ref 32.0–36.0)
MCV: 78.2 fL — ABNORMAL LOW (ref 80.0–100.0)
Platelets: 230 10*3/uL (ref 150–440)
RBC: 3.93 MIL/uL — AB (ref 4.40–5.90)
RDW: 18 % — ABNORMAL HIGH (ref 11.5–14.5)
WBC: 8.6 10*3/uL (ref 3.8–10.6)

## 2017-07-14 LAB — MAGNESIUM: Magnesium: 1.8 mg/dL (ref 1.7–2.4)

## 2017-07-14 LAB — GLUCOSE, CAPILLARY: Glucose-Capillary: 152 mg/dL — ABNORMAL HIGH (ref 65–99)

## 2017-07-14 LAB — TROPONIN I: Troponin I: <0.03 ng/mL (ref <0.03)

## 2017-07-14 MED ORDER — METHYLPREDNISOLONE SODIUM SUCC 125 MG IJ SOLR
60.0000 mg | Freq: Two times a day (BID) | INTRAMUSCULAR | Status: DC
  Administered 2017-07-14 – 2017-07-16 (×4): 60 mg via INTRAVENOUS
  Filled 2017-07-14 (×4): qty 2

## 2017-07-14 NOTE — Progress Notes (Signed)
Telemetry clerk notified Primary nurse that pt had a run of Ventricular Trigeminy. VSS;  Primary nurse paged and spoke to Dr. Caryn BeeMaier in regard to the abnormal rhythm. Results were received for a BMP along with magnesium for AM Labs. Primary nurse to continue to monitor.

## 2017-07-14 NOTE — Progress Notes (Signed)
[employer] [facility] - [place] at [facility]   PATIENT NAME: Stephen Brady    MR#:  [id number]  DATE OF BIRTH:  Apr 06, 1951  SUBJECTIVE:  CHIEF COMPLAINT:   Chief Complaint  Patient presents with  . Chest Pain  . Shortness of Breath  . Cough    Came with cough, chest pain and shortness of breath. Noted to have influenza. Chest pain is resolved.  REVIEW OF SYSTEMS:  CONSTITUTIONAL: No fever, fatigue or weakness.  EYES: No blurred or double vision.  EARS, NOSE, AND THROAT: No tinnitus or ear pain.  RESPIRATORY: Positive for cough, shortness of breath, no wheezing or hemoptysis.  CARDIOVASCULAR: No chest pain, orthopnea, edema.  GASTROINTESTINAL: No nausea, vomiting, diarrhea or abdominal pain.  GENITOURINARY: No dysuria, hematuria.  ENDOCRINE: No polyuria, nocturia,  HEMATOLOGY: No anemia, easy bruising or bleeding SKIN: No rash or lesion. MUSCULOSKELETAL: No joint pain or arthritis.   NEUROLOGIC: No tingling, numbness, weakness.  PSYCHIATRY: No anxiety or depression.   ROS  DRUG ALLERGIES:   Allergies  Allergen Reactions  . Penicillins Hives    Has patient had a PCN reaction causing immediate rash, facial/tongue/throat swelling, SOB or lightheadedness with hypotension: Unknown Has patient had a PCN reaction causing severe rash involving mucus membranes or skin necrosis: Unknown Has patient had a PCN reaction that required hospitalization: Unknown Has patient had a PCN reaction occurring within the last 10 years: Yes If all of the above answers are "NO", then may proceed with Cephalosporin use.    VITALS:  Blood pressure (!) 106/54, pulse 88, temperature 97.8 F (36.6 C), temperature source Oral, resp. rate 18, height 5\' 7"  (1.702 m), weight 70.2 kg (154 lb 11.2 oz), SpO2 93 %.  PHYSICAL EXAMINATION:  GENERAL:  67 y.o.-year-old patient lying in the bed with no acute distress.  EYES: Pupils equal, round, reactive to light and accommodation. No scleral icterus.  Extraocular muscles intact.  HEENT: Head atraumatic, normocephalic. Oropharynx and nasopharynx clear.  NECK:  Supple, no jugular venous distention. No thyroid enlargement, no tenderness.  LUNGS: Normal breath sounds bilaterally, no wheezing, rales,rhonchi or crepitation. No use of accessory muscles of respiration.  CARDIOVASCULAR: S1, S2 normal. No murmurs, rubs, or gallops.  ABDOMEN: Soft, nontender, nondistended. Bowel sounds present. No organomegaly or mass.  EXTREMITIES: No pedal edema, cyanosis, or clubbing.  NEUROLOGIC: Cranial nerves II through XII are intact. Muscle strength 5/5 in all extremities. Sensation intact. Gait not checked.  PSYCHIATRIC: The patient is alert and oriented x 3.  SKIN: No obvious rash, lesion, or ulcer.   Physical Exam LABORATORY PANEL:   CBC Recent Labs  Lab 07/14/17 0500  WBC 8.6  HGB 10.3*  HCT 30.7*  PLT 230   ------------------------------------------------------------------------------------------------------------------  Chemistries  Recent Labs  Lab 07/14/17 0500  NA 137  K 4.0  CL 110  CO2 22  GLUCOSE 161*  BUN 21*  CREATININE 0.84  CALCIUM 8.9  MG 1.8  AST 20  ALT 20  ALKPHOS 57  BILITOT 0.5   ------------------------------------------------------------------------------------------------------------------  Cardiac Enzymes Recent Labs  Lab 07/13/17 2250 07/14/17 0500  TROPONINI <0.03 <0.03   ------------------------------------------------------------------------------------------------------------------  RADIOLOGY:  Ct Angio Chest Pe W And/or Wo Contrast  Result Date: 07/13/2017 CLINICAL DATA:  Left-sided chest pain and shortness of breath. EXAM: CT ANGIOGRAPHY CHEST WITH CONTRAST TECHNIQUE: Multidetector CT imaging of the chest was performed using the standard protocol during bolus administration of intravenous contrast. Multiplanar CT image reconstructions and MIPs were obtained to evaluate the vascular anatomy.  CONTRAST:  75mL ISOVUE-370 IOPAMIDOL (ISOVUE-370) INJECTION 76% COMPARISON:  07/13/2017 FINDINGS: Cardiovascular: The heart size appears normal. Aortic atherosclerosis. Calcification within the LAD and left circumflex coronary arteries noted. No pericardial effusion. The main pulmonary artery appears patent. No saddle embolus or central obstructing emboli identified. No lobar or segmental pulmonary artery filling defects identified. Mediastinum/Nodes: Normal appearance of the thyroid gland. The trachea appears patent and is midline. Normal appearance of the esophagus. No axillary or supraclavicular adenopathy. Mild mediastinal adenopathy and hilar adenopathy identified. AP window lymph node measures 11 mm, image 41 of series 4. Subcarinal lymph node measures 1.4 cm. Left hilar lymph node measures 0.9 cm. Lungs/Pleura: No pleural effusion identified. Advanced changes of emphysema identified. Mild pneumonitis within the posterior and medial left lower lobe. No airspace consolidation or atelectasis identified. No pneumothorax. Within the left upper lobe there is a small nodule with spiculated margins measuring 8 mm, image 39 of series 9. Left lung perifissural nodule likely represents an intrapulmonary lymph node Upper Abdomen: No acute abnormality. Small stone identified within the dependent portion of the gallbladder. Musculoskeletal: No aggressive lytic or sclerotic bone lesions. Review of the MIP images confirms the above findings. IMPRESSION: 1. No evidence for acute pulmonary embolus. 2. Left upper lobe pulmonary nodule measures 8 mm. Non-contrast chest CT at 6-12 months is recommended. If the nodule is stable at time of repeat CT, then future CT at 18-24 months (from today's scan) is considered optional for low-risk patients, but is recommended for high-risk patients. This recommendation follows the consensus statement: Guidelines for Management of Incidental Pulmonary Nodules Detected on CT Images:From the  Fleischner Society 2017; published online before print (10.1148/radiol.[id number]). 3. Mild mediastinal and hilar adenopathy identified. 4. Aortic Atherosclerosis (ICD10-I70.0) and Emphysema (ICD10-J43.9). 5. Two vessel coronary artery atherosclerotic calcifications. Electronically Signed   By: Signa Kell M.D.   On: 07/13/2017 12:10   Dg Chest Portable 1 View  Result Date: 07/13/2017 CLINICAL DATA:  Chest pain and cough EXAM: PORTABLE CHEST 1 VIEW COMPARISON:  None. FINDINGS: 0919 hours. The lungs are clear without focal pneumonia, edema, pneumothorax or pleural effusion. Small pulmonary nodule identified left upper lobe. Interstitial markings are diffusely coarsened with chronic features. The cardio pericardial silhouette is enlarged. The visualized bony structures of the thorax are intact. Telemetry leads overlie the chest. IMPRESSION: 1. Left upper lobe pulmonary nodule. CT chest without contrast recommended to further evaluate. 2. Cardiomegaly with chronic interstitial changes. Electronically Signed   By: Kennith Center M.D.   On: 07/13/2017 09:35    ASSESSMENT AND PLAN:   Principal Problem:   COPD exacerbation (HCC) Active Problems:   Influenza A   Acute respiratory distress   Chest pain, atypical  * Acute respiratory distress  COPD exacerbation   Influenza A    IV steroid, change the frequency to twice a day.   Nebulizer treatment, tamiflu.   Admitting doctor has also started on levofloxacin, but patient doesn't seem to be having any bacterial infection, his symptoms can be explained by flu so I will stop Levaquin.   Continue supplemental oxygen for now and try to taper as tolerated.  * Atypical chest pain   Seen by cardiologist, appreciated help, no further workup at this time most likely secondary to infection. Advised to continue isosorbide and Cardizem.   3 troponins are negative.  * History of hypertension   Hold lisinopril for now because of blood pressures running  normal.   All the records are reviewed and case discussed with  Care Management/Social Workerr. Management plans discussed with the patient, family and they are in agreement.  CODE STATUS: full.  TOTAL TIME TAKING CARE OF THIS PATIENT: 35 minutes.     POSSIBLE D/C IN 1-2 DAYS, DEPENDING ON CLINICAL CONDITION.   Altamese DillingVaibhavkumar Shean Gerding M.D on 07/14/2017   Between 7am to 6pm - Pager - [phone]  After 6pm go to [email or web address] - password [email or web address]  [facility]  Office  [phone]  CC: Primary care physician; System, Pcp Not In  Note: This dictation was prepared with Dragon dictation along with smaller phrase technology. Any transcriptional errors that result from this process are unintentional.

## 2017-07-14 NOTE — Consult Note (Signed)
Pulmonary Critical Care  Initial Consult Note  Stephen Brady RUE:[id number] DOB: [birth year] DOA: 07/13/2017  Referring physician: Dr Darnelle Catalan  Chief Complaint: Shortness of breath  HPI: Stephen Brady is a 67 y.o. 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  Presented to the hospital with increasing shortness of breath.  Patient also had been exhibiting some chest pain.  Patient was evaluated in the emergency department and had a CT scan chest done which was negative for pulmonary embolism in also negative for pneumonia.  He was tested for influenza and was actually positive for influenza A. Since the patient was having pain he was admitted for further evaluation.  Right now patient states that he is clinically improving his pain is improved shortness of breath is improved denies having any cough no fevers no chills.  Denies having any nausea vomiting or diarrhea.  Review of Systems:  Constitutional:  No weight loss, night sweats, Fevers, chills, fatigue.  HEENT:  No headaches, nasal congestion, post nasal drip,  Cardio-vascular:  No chest pain, Orthopnea, PND, swelling in lower extremities, anasarca, dizziness, palpitations  GI:  No heartburn, indigestion, abdominal pain, nausea, vomiting, diarrhea  Resp:  No shortness of breath with exertion or at rest. no productive cough, No coughing up of blood.No wheezing Skin:  no rash or lesions.  Musculoskeletal:  No joint pain or swelling.   Remainder ROS performed and is unremarkable other than noted in HPI  Past Medical History:  Diagnosis Date  . Asthma   . COPD (chronic obstructive pulmonary disease) (HCC)   . Coronary artery disease   . Hypertension    History reviewed. No pertinent surgical history. Social History:  reports that he quit smoking about 9 years ago. he has never used smokeless tobacco. He reports that he does not drink alcohol or use drugs.  Allergies  Allergen Reactions  .  Penicillins Hives    Has patient had a PCN reaction causing immediate rash, facial/tongue/throat swelling, SOB or lightheadedness with hypotension: Unknown Has patient had a PCN reaction causing severe rash involving mucus membranes or skin necrosis: Unknown Has patient had a PCN reaction that required hospitalization: Unknown Has patient had a PCN reaction occurring within the last 10 years: Yes If all of the above answers are "NO", then may proceed with Cephalosporin use.    History reviewed. No pertinent family history.  Prior to Admission medications   Medication Sig Start Date End Date Taking? Authorizing Provider  aspirin 325 MG tablet Take 325 mg by mouth daily.   Yes [provider]  atorvastatin (LIPITOR) 10 MG tablet Take 10 mg by mouth daily.   Yes [provider]  diltiazem (CARDIZEM) 30 MG tablet Take 30 mg by mouth 2 (two) times daily.   Yes [provider]  isosorbide mononitrate (IMDUR) 30 MG 24 hr tablet Take 30 mg by mouth daily.   Yes [provider]  lisinopril (PRINIVIL,ZESTRIL) 2.5 MG tablet Take 2.5 mg by mouth daily.   Yes [provider]  nitroGLYCERIN (NITROSTAT) 0.4 MG SL tablet Place 0.4 mg under the tongue every 5 (five) minutes as needed for chest pain.   Yes [provider]  pantoprazole (PROTONIX) 40 MG tablet Take 40 mg by mouth daily.   Yes [provider]   Physical Exam: Vitals:   07/14/17 0032 07/14/17 0701 07/14/17 0753 07/14/17 0924  BP: (!) 113/55 111/69  105/64  Pulse: 82 73  85  Resp:  20    Temp:  (!) 97.5 F (36.4 C)    TempSrc:  Oral    SpO2: 96% 98% 97%   Weight:  154 lb 11.2 oz (70.2 kg)    Height:        Wt Readings from Last 3 Encounters:  07/14/17 154 lb 11.2 oz (70.2 kg)    General:  Appears calm and comfortable Eyes: PERRL, normal lids, irises & conjunctiva ENT: grossly normal hearing, lips & tongue Neck: no LAD, masses or thyromegaly Cardiovascular: RRR, no  m/r/g. No LE edema. Respiratory: CTA bilaterally, no w/r/r.       Normal respiratory effort. Abdomen: soft, nontender Skin: no rash or induration seen on limited exam Musculoskeletal: grossly normal tone BUE/BLE Psychiatric: grossly normal mood and affect Neurologic: grossly non-focal.          Labs on Admission:  Basic Metabolic Panel: Recent Labs  Lab 07/13/17 0859 07/14/17 0500  NA 138 137  K 3.9 4.0  CL 105 110  CO2 21* 22  GLUCOSE 121* 161*  BUN 13 21*  CREATININE 0.92 0.84  CALCIUM 10.1 8.9  MG  --  1.8   Liver Function Tests: Recent Labs  Lab 07/13/17 0859 07/14/17 0500  AST 32 20  ALT 28 20  ALKPHOS 89 57  BILITOT 0.9 0.5  PROT 7.4 5.6*  ALBUMIN 4.2 3.2*   No results for input(s): LIPASE, AMYLASE in the last 168 hours. No results for input(s): AMMONIA in the last 168 hours. CBC: Recent Labs  Lab 07/13/17 0859 07/14/17 0500  WBC 14.4* 8.6  NEUTROABS 10.4*  --   HGB 12.9* 10.3*  HCT 40.1 30.7*  MCV 78.6* 78.2*  PLT 285 230   Cardiac Enzymes: Recent Labs  Lab 07/13/17 0859 07/13/17 1713 07/13/17 2250 07/14/17 0500  TROPONINI <0.03 <0.03 <0.03 <0.03    BNP (last 3 results) Recent Labs    07/13/17 0859  BNP 79.0    ProBNP (last 3 results) No results for input(s): PROBNP in the last 8760 hours.  CBG: Recent Labs  Lab 07/14/17 0810  GLUCAP 152*    Radiological Exams on Admission: Ct Angio Chest Pe W And/or Wo Contrast  Result Date: 07/13/2017 CLINICAL DATA:  Left-sided chest pain and shortness of breath. EXAM: CT ANGIOGRAPHY CHEST WITH CONTRAST TECHNIQUE: Multidetector CT imaging of the chest was performed using the standard protocol during bolus administration of intravenous contrast. Multiplanar CT image reconstructions and MIPs were obtained to evaluate the vascular anatomy. CONTRAST:  75mL ISOVUE-370 IOPAMIDOL (ISOVUE-370) INJECTION 76% COMPARISON:  07/13/2017 FINDINGS: Cardiovascular: The heart size appears normal. Aortic  atherosclerosis. Calcification within the LAD and left circumflex coronary arteries noted. No pericardial effusion. The main pulmonary artery appears patent. No saddle embolus or central obstructing emboli identified. No lobar or segmental pulmonary artery filling defects identified. Mediastinum/Nodes: Normal appearance of the thyroid gland. The trachea appears patent and is midline. Normal appearance of the esophagus. No axillary or supraclavicular adenopathy. Mild mediastinal adenopathy and hilar adenopathy identified. AP window lymph node measures 11 mm, image 41 of series 4. Subcarinal lymph node measures 1.4 cm. Left hilar lymph node measures 0.9 cm. Lungs/Pleura: No pleural effusion identified. Advanced changes of emphysema identified. Mild pneumonitis within the posterior and medial left lower lobe. No airspace consolidation or atelectasis identified. No pneumothorax. Within the left upper lobe there is a small nodule with spiculated margins measuring 8 mm, image 39 of series 9. Left lung perifissural nodule likely represents an intrapulmonary lymph node Upper  Abdomen: No acute abnormality. Small stone identified within the dependent portion of the gallbladder. Musculoskeletal: No aggressive lytic or sclerotic bone lesions. Review of the MIP images confirms the above findings. IMPRESSION: 1. No evidence for acute pulmonary embolus. 2. Left upper lobe pulmonary nodule measures 8 mm. Non-contrast chest CT at 6-12 months is recommended. If the nodule is stable at time of repeat CT, then future CT at 18-24 months (from today's scan) is considered optional for low-risk patients, but is recommended for high-risk patients. This recommendation follows the consensus statement: Guidelines for Management of Incidental Pulmonary Nodules Detected on CT Images:From the Fleischner Society 2017; published online before print (10.1148/[id number]). 3. Mild mediastinal and hilar adenopathy identified. 4. Aortic  Atherosclerosis (ICD10-I70.0) and Emphysema (ICD10-J43.9). 5. Two vessel coronary artery atherosclerotic calcifications. Electronically Signed   By: Signa Kellaylor  Stroud M.D.   On: 07/13/2017 12:10   Dg Chest Portable 1 View  Result Date: 07/13/2017 CLINICAL DATA:  Chest pain and cough EXAM: PORTABLE CHEST 1 VIEW COMPARISON:  None. FINDINGS: 0919 hours. The lungs are clear without focal pneumonia, edema, pneumothorax or pleural effusion. Small pulmonary nodule identified left upper lobe. Interstitial markings are diffusely coarsened with chronic features. The cardio pericardial silhouette is enlarged. The visualized bony structures of the thorax are intact. Telemetry leads overlie the chest. IMPRESSION: 1. Left upper lobe pulmonary nodule. CT chest without contrast recommended to further evaluate. 2. Cardiomegaly with chronic interstitial changes. Electronically Signed   By: Kennith CenterEric  Mansell M.D.   On: 07/13/2017 09:35    EKG: Independently reviewed.  Assessment/Plan Principal Problem:   COPD exacerbation (HCC) Active Problems:   Influenza A   Acute respiratory distress   Chest pain, atypical   1.   Influenza a  2. COPD with exacerbation 3. Atypical chest pain   clinically patient is improving would continue with present management.  Patient will have to follow up in the office we will assess his respiratory status at that time.  Will probably need pulmonary functions.  Patient has been a smoker and states he has quit about 8 years ago.  He will have his medications assessed and evaluated.  Apparently however he states he is from out of town and so therefore will more than likely follow-up with his primary care physician.  Code Status:   Full code   Family Communication:  Friend who was in the room  Disposition Plan:  home   Time spent:  70 min  I have personally obtained a history, examined the patient, evaluated laboratory and imaging results, formulated the assessment and plan and placed  orders.  The Patient requires high complexity decision making for assessment and support. Total Time Spent 70min   Yevonne PaxSaadat A Khan, MD [facility] Pulmonary Critical Care Medicine Sleep Medicine

## 2017-07-14 NOTE — Consult Note (Signed)
[facility] Cardiology Consultation Note  Patient ID: Stephen Brady, MRN: [id number], DOB/AGE: [id number] 69 y.o. Admit date: 07/13/2017   Date of Consult: 07/14/2017 Primary Physician: System, Pcp Not In Primary Cardiologist: None  Chief Complaint:  Chief Complaint  Patient presents with  . Chest Pain  . Shortness of Breath  . Cough   Reason for Consult: Chest pain  HPI: 67 y.o. male with known coronary artery disease status post previous myocardial infarction and PCI and stent placement and multiple arteries throughout the last many years with tobacco abuse essential hypertension and mixed hyperlipidemia.  The patient has had a stent placed back mid last year after unstable angina and has done fairly well with his medication management.  The patient also has had appropriate oxygenation with 2 L of oxygen for chronic obstructive pulmonary disease and emphysema.  This has been stable until he came to visit his brother and had some chest discomfort substernal radiating into his back and sometimes on his right side as well.  This is waxing and waning at times with an EKG showing normal sinus were rhythm with left axis deviation.  The patient additionally has had troponin levels which were normal without evidence of acute coronary syndrome or myocardial infarction.  Since last night the patient does have positive for flu which may be contributing to his current symptoms as well as no further episodes of chest discomfort.  The patient is hemodynamically stable at this time  Past Medical History:  Diagnosis Date  . Asthma   . COPD (chronic obstructive pulmonary disease) (HCC)   . Coronary artery disease   . Hypertension       Surgical History: History reviewed. No pertinent surgical history.   Home Meds: Prior to Admission medications   Medication Sig Start Date End Date Taking? Authorizing Provider  aspirin 325 MG tablet Take 325 mg by mouth daily.   Yes [provider]   atorvastatin (LIPITOR) 10 MG tablet Take 10 mg by mouth daily.   Yes [provider]  diltiazem (CARDIZEM) 30 MG tablet Take 30 mg by mouth 2 (two) times daily.   Yes [provider]  isosorbide mononitrate (IMDUR) 30 MG 24 hr tablet Take 30 mg by mouth daily.   Yes [provider]  lisinopril (PRINIVIL,ZESTRIL) 2.5 MG tablet Take 2.5 mg by mouth daily.   Yes [provider]  nitroGLYCERIN (NITROSTAT) 0.4 MG SL tablet Place 0.4 mg under the tongue every 5 (five) minutes as needed for chest pain.   Yes [provider]  pantoprazole (PROTONIX) 40 MG tablet Take 40 mg by mouth daily.   Yes [provider]    Inpatient Medications:  . aspirin EC  325 mg Oral Daily  . atorvastatin  10 mg Oral Daily  . diltiazem  30 mg Oral BID  . docusate sodium  100 mg Oral BID  . enoxaparin (LOVENOX) injection  40 mg Subcutaneous Q24H  . ipratropium-albuterol  3 mL Nebulization QID  . isosorbide mononitrate  30 mg Oral Daily  . lisinopril  2.5 mg Oral Daily  . methylPREDNISolone (SOLU-MEDROL) injection  60 mg Intravenous Q6H  . oseltamivir  75 mg Oral BID  . pantoprazole  40 mg Oral Daily   . sodium chloride 75 mL/hr at 07/13/17 1735  . levofloxacin (LEVAQUIN) IV      Allergies:  Allergies  Allergen Reactions  . Penicillins Hives    Has patient had a PCN reaction causing immediate rash, facial/tongue/throat swelling, SOB  or lightheadedness with hypotension: Unknown Has patient had a PCN reaction causing severe rash involving mucus membranes or skin necrosis: Unknown Has patient had a PCN reaction that required hospitalization: Unknown Has patient had a PCN reaction occurring within the last 10 years: Yes If all of the above answers are "NO", then may proceed with Cephalosporin use.    Social History   Socioeconomic History  . Marital status: Divorced    Spouse name: Not on file  . Number of children: Not on file  . Years of education: Not  on file  . Highest education level: Not on file  Social Needs  . Financial resource strain: Not on file  . Food insecurity - worry: Not on file  . Food insecurity - inability: Not on file  . Transportation needs - medical: Not on file  . Transportation needs - non-medical: Not on file  Occupational History  . Not on file  Tobacco Use  . Smoking status: Former Smoker    Last attempt to quit: 07/13/2008    Years since quitting: 9.0  . Smokeless tobacco: Never Used  Substance and Sexual Activity  . Alcohol use: No    Frequency: Never  . Drug use: No  . Sexual activity: Not on file  Other Topics Concern  . Not on file  Social History Narrative  . Not on file     History reviewed. No pertinent family history.   Review of Systems Positive for cough congestion Negative for: General:  chills, fever, night sweats or weight changes.  Cardiovascular: PND orthopnea syncope dizziness  Dermatological skin lesions rashes Respiratory: Positive for cough congestion Urologic: Frequent urination urination at night and hematuria Abdominal: negative for nausea, vomiting, diarrhea, bright red blood per rectum, melena, or hematemesis Neurologic: negative for visual changes, and/or hearing changes  All other systems reviewed and are otherwise negative except as noted above.  Labs: Recent Labs    07/13/17 0859 07/13/17 1713 07/13/17 2250 07/14/17 0500  TROPONINI <0.03 <0.03 <0.03 <0.03   Lab Results  Component Value Date   WBC 8.6 07/14/2017   HGB 10.3 (L) 07/14/2017   HCT 30.7 (L) 07/14/2017   MCV 78.2 (L) 07/14/2017   PLT 230 07/14/2017    Recent Labs  Lab 07/14/17 0500  NA 137  K 4.0  CL 110  CO2 22  BUN 21*  CREATININE 0.84  CALCIUM 8.9  PROT 5.6*  BILITOT 0.5  ALKPHOS 57  ALT 20  AST 20  GLUCOSE 161*   No results found for: CHOL, HDL, LDLCALC, TRIG No results found for: DDIMER  Radiology/Studies:  Ct Angio Chest Pe W And/or Wo Contrast  Result Date:  07/13/2017 CLINICAL DATA:  Left-sided chest pain and shortness of breath. EXAM: CT ANGIOGRAPHY CHEST WITH CONTRAST TECHNIQUE: Multidetector CT imaging of the chest was performed using the standard protocol during bolus administration of intravenous contrast. Multiplanar CT image reconstructions and MIPs were obtained to evaluate the vascular anatomy. CONTRAST:  75mL ISOVUE-370 IOPAMIDOL (ISOVUE-370) INJECTION 76% COMPARISON:  07/13/2017 FINDINGS: Cardiovascular: The heart size appears normal. Aortic atherosclerosis. Calcification within the LAD and left circumflex coronary arteries noted. No pericardial effusion. The main pulmonary artery appears patent. No saddle embolus or central obstructing emboli identified. No lobar or segmental pulmonary artery filling defects identified. Mediastinum/Nodes: Normal appearance of the thyroid gland. The trachea appears patent and is midline. Normal appearance of the esophagus. No axillary or supraclavicular adenopathy. Mild mediastinal adenopathy and hilar adenopathy identified. AP window lymph node measures  11 mm, image 41 of series 4. Subcarinal lymph node measures 1.4 cm. Left hilar lymph node measures 0.9 cm. Lungs/Pleura: No pleural effusion identified. Advanced changes of emphysema identified. Mild pneumonitis within the posterior and medial left lower lobe. No airspace consolidation or atelectasis identified. No pneumothorax. Within the left upper lobe there is a small nodule with spiculated margins measuring 8 mm, image 39 of series 9. Left lung perifissural nodule likely represents an intrapulmonary lymph node Upper Abdomen: No acute abnormality. Small stone identified within the dependent portion of the gallbladder. Musculoskeletal: No aggressive lytic or sclerotic bone lesions. Review of the MIP images confirms the above findings. IMPRESSION: 1. No evidence for acute pulmonary embolus. 2. Left upper lobe pulmonary nodule measures 8 mm. Non-contrast chest CT at 6-12  months is recommended. If the nodule is stable at time of repeat CT, then future CT at 18-24 months (from today's scan) is considered optional for low-risk patients, but is recommended for high-risk patients. This recommendation follows the consensus statement: Guidelines for Management of Incidental Pulmonary Nodules Detected on CT Images:From the Fleischner Society 2017; published online before print (10.1148/[id number]). 3. Mild mediastinal and hilar adenopathy identified. 4. Aortic Atherosclerosis (ICD10-I70.0) and Emphysema (ICD10-J43.9). 5. Two vessel coronary artery atherosclerotic calcifications. Electronically Signed   By: Signa Kellaylor  Stroud M.D.   On: 07/13/2017 12:10   Dg Chest Portable 1 View  Result Date: 07/13/2017 CLINICAL DATA:  Chest pain and cough EXAM: PORTABLE CHEST 1 VIEW COMPARISON:  None. FINDINGS: 0919 hours. The lungs are clear without focal pneumonia, edema, pneumothorax or pleural effusion. Small pulmonary nodule identified left upper lobe. Interstitial markings are diffusely coarsened with chronic features. The cardio pericardial silhouette is enlarged. The visualized bony structures of the thorax are intact. Telemetry leads overlie the chest. IMPRESSION: 1. Left upper lobe pulmonary nodule. CT chest without contrast recommended to further evaluate. 2. Cardiomegaly with chronic interstitial changes. Electronically Signed   By: Kennith CenterEric  Mansell M.D.   On: 07/13/2017 09:35    EKG: Normal sinus rhythm with left axis deviation and incomplete right bundle branch block  Weights: Filed Weights   07/13/17 0853  Weight: 147 lb (66.7 kg)     Physical Exam: Blood pressure (!) 113/55, pulse 82, temperature 97.8 F (36.6 C), temperature source Oral, resp. rate 20, height 5\' 7"  (1.702 m), weight 147 lb (66.7 kg), SpO2 96 %. Body mass index is 23.02 kg/m. General: Well developed, well nourished, in no acute distress. Head eyes ears nose throat: Normocephalic, atraumatic, sclera  non-icteric, no xanthomas, nares are without discharge. No apparent thyromegaly and/or mass  Lungs: Normal respiratory effort.  Diffuse wheezes, no rales, some rhonchi.  Heart: RRR with normal S1 S2. no murmur gallop, no rub, PMI is normal size and placement, carotid upstroke normal without bruit, jugular venous pressure is normal Abdomen: Soft, non-tender, non-distended with normoactive bowel sounds. No hepatomegaly. No rebound/guarding. No obvious abdominal masses. Abdominal aorta is normal size without bruit Extremities: Trace edema. no cyanosis, no clubbing, no ulcers  Peripheral : 2+ bilateral upper extremity pulses, 2+ bilateral femoral pulses, 2+ bilateral dorsal pedal pulse Neuro: Alert and oriented. No facial asymmetry. No focal deficit. Moves all extremities spontaneously. Musculoskeletal: Normal muscle tone without kyphosis Psych:  Responds to questions appropriately with a normal affect.    Assessment: 67 year old male with known coronary disease status post previous microinfarction multiple stents emphysema essential hypertension mixed hyperlipidemia having progressive chest discomfort atypical in nature without evidence of acute coronary syndrome or myocardial infarction  Plan: 1.  Continue supportive care of recent flu and cough and congestion 2.  Continue risk factor management with treatment for hypertension hyperlipidemia and continued use of aspirin 3.  Isosorbide for chest discomfort for which the patient has typically taking 4.  Continue treatment of COPD and oxygenation issues with chronic O2 supplementation 5.  Begin ambulation and follow for worsening chest discomfort.  If patient has full resolution of chest discomfort with current above treatment would be okay for discharge to home with follow-up next week for further treatment and adjustments of medications.  If patient has worsening chest discomfort or consistent with true angina would consider further intervention  including cardiac catheterization  Signed, Lamar Blinks M.D. [facility] 07/14/2017, 6:21 AM

## 2017-07-15 ENCOUNTER — Encounter
Admission: EM | Disposition: A | Discharge status: Home / Self Care | Payer: Self-pay | Source: Home / Self Care | Attending: Internal Medicine

## 2017-07-15 HISTORY — PX: LEFT HEART CATH AND CORONARY ANGIOGRAPHY: CATH118249

## 2017-07-15 LAB — GLUCOSE, CAPILLARY: Glucose-Capillary: 137 mg/dL — ABNORMAL HIGH (ref 65–99)

## 2017-07-15 LAB — ECHOCARDIOGRAM COMPLETE
Height: 67 in
WEIGHTICAEL: 2475.2 [oz_av]

## 2017-07-15 SURGERY — LEFT HEART CATH AND CORONARY ANGIOGRAPHY
Anesthesia: Moderate Sedation

## 2017-07-15 MED ORDER — ALPRAZOLAM 0.5 MG PO TABS
0.5000 mg | ORAL_TABLET | Freq: Every evening | ORAL | Status: DC | PRN
  Administered 2017-07-15: 0.5 mg via ORAL
  Filled 2017-07-15: qty 1

## 2017-07-15 MED ORDER — MIDAZOLAM HCL 2 MG/2ML IJ SOLN
INTRAMUSCULAR | Status: DC | PRN
  Administered 2017-07-15: 1 mg via INTRAVENOUS

## 2017-07-15 MED ORDER — ACETAMINOPHEN 325 MG PO TABS: 650.0000 mg | ORAL_TABLET | ORAL | Status: DC | PRN

## 2017-07-15 MED ORDER — IOPAMIDOL (ISOVUE-300) INJECTION 61%
INTRAVENOUS | Status: DC | PRN
  Administered 2017-07-15: 110 mL via INTRA_ARTERIAL

## 2017-07-15 MED ORDER — HEPARIN (PORCINE) IN NACL 2-0.9 UNIT/ML-% IJ SOLN
INTRAMUSCULAR | Status: AC
  Filled 2017-07-15: qty 1000

## 2017-07-15 MED ORDER — SODIUM CHLORIDE 0.9% FLUSH: 3.0000 mL | Freq: Two times a day (BID) | INTRAVENOUS | Status: DC

## 2017-07-15 MED ORDER — IPRATROPIUM-ALBUTEROL 0.5-2.5 (3) MG/3ML IN SOLN
3.0000 mL | Freq: Three times a day (TID) | RESPIRATORY_TRACT | Status: DC
  Administered 2017-07-15 – 2017-07-16 (×3): 3 mL via RESPIRATORY_TRACT
  Filled 2017-07-15 (×2): qty 3

## 2017-07-15 MED ORDER — SODIUM CHLORIDE 0.9% FLUSH
3.0000 mL | INTRAVENOUS | Status: DC | PRN
Start: 2017-07-15 — End: 2017-07-15

## 2017-07-15 MED ORDER — ONDANSETRON HCL 4 MG/2ML IJ SOLN: 4.0000 mg | Freq: Four times a day (QID) | INTRAMUSCULAR | Status: DC | PRN

## 2017-07-15 MED ORDER — SODIUM CHLORIDE 0.9 % IV SOLN
250.0000 mL | INTRAVENOUS | Status: DC | PRN
Start: 2017-07-15 — End: 2017-07-15

## 2017-07-15 MED ORDER — HYDROCODONE-ACETAMINOPHEN 5-325 MG PO TABS: 1.0000 | ORAL_TABLET | Freq: Three times a day (TID) | ORAL | Status: DC | PRN

## 2017-07-15 MED ORDER — SODIUM CHLORIDE 0.9 % WEIGHT BASED INFUSION: 3.0000 mL/kg/h | INTRAVENOUS | Status: DC

## 2017-07-15 MED ORDER — FENTANYL CITRATE (PF) 100 MCG/2ML IJ SOLN
INTRAMUSCULAR | Status: DC | PRN
  Administered 2017-07-15: 50 ug via INTRAVENOUS

## 2017-07-15 MED ORDER — ASPIRIN 81 MG PO CHEW: 81.0000 mg | CHEWABLE_TABLET | ORAL | Status: DC

## 2017-07-15 MED ORDER — FENTANYL CITRATE (PF) 100 MCG/2ML IJ SOLN
INTRAMUSCULAR | Status: AC
  Filled 2017-07-15: qty 2

## 2017-07-15 MED ORDER — SODIUM CHLORIDE 0.9 % WEIGHT BASED INFUSION: 1.0000 mL/kg/h | INTRAVENOUS | Status: DC

## 2017-07-15 MED ORDER — IPRATROPIUM-ALBUTEROL 0.5-2.5 (3) MG/3ML IN SOLN: 3.0000 mL | RESPIRATORY_TRACT | Status: DC | PRN

## 2017-07-15 MED ORDER — MIDAZOLAM HCL 2 MG/2ML IJ SOLN
INTRAMUSCULAR | Status: AC
  Filled 2017-07-15: qty 2

## 2017-07-15 MED ORDER — SODIUM CHLORIDE 0.9 % WEIGHT BASED INFUSION: 1.0000 mL/kg/h | INTRAVENOUS | Status: AC

## 2017-07-15 SURGICAL SUPPLY — 9 items
CATH INFINITI 5FR ANG PIGTAIL (CATHETERS) ×3 IMPLANT
CATH INFINITI 5FR JL4 (CATHETERS) ×3 IMPLANT
CATH INFINITI JR4 5F (CATHETERS) ×3 IMPLANT
DEVICE CLOSURE MYNXGRIP 5F (Vascular Products) ×3 IMPLANT
KIT MANI 3VAL PERCEP (MISCELLANEOUS) ×3 IMPLANT
NEEDLE PERC 18GX7CM (NEEDLE) ×3 IMPLANT
PACK CARDIAC CATH (CUSTOM PROCEDURE TRAY) ×3 IMPLANT
SHEATH AVANTI 5FR X 11CM (SHEATH) ×3 IMPLANT
WIRE GUIDERIGHT .035X150 (WIRE) ×3 IMPLANT

## 2017-07-15 NOTE — Progress Notes (Signed)
[facility] Cardiology [facility] Encounter Note  Patient: Rafferty Postlewait / Admit Date: 07/13/2017 / Date of Encounter: 07/15/2017, 4:37 PM   Subjective: Patient has had worsening episodes of chest discomfort substernal in nature requiring nitroglycerin multiple times over the night and unable to do any physical activity.  Shortness of breath not significantly changed from before.  No evidence of congestive heart failure.  Troponin still normal Cardiac catheterization showing normal LV systolic function with ejection fraction of 55% Long stents proximal left circumflex and left anterior descending artery patent No evidence of significant coronary stenosis requiring further intervention  Review of Systems: Positive for: Shortness of breath chest pain Negative for: Vision change, hearing change, syncope, dizziness, nausea, vomiting,diarrhea, bloody stool, stomach pain, cough, congestion, diaphoresis, urinary frequency, urinary pain,skin lesions, skin rashes Others previously listed  Objective: Telemetry: Sinus rhythm Physical Exam: Blood pressure (!) 150/72, pulse 79, temperature 97.9 F (36.6 C), temperature source Oral, resp. rate 20, height 5\' 7"  (1.702 m), weight 157 lb 13.6 oz (71.6 kg), SpO2 99 %. Body mass index is 24.72 kg/m. General: Well developed, well nourished, in no acute distress. Head: Normocephalic, atraumatic, sclera non-icteric, no xanthomas, nares are without discharge. Neck: No apparent masses Lungs: Normal respirations with use wheezes, some rhonchi, no rales , no crackles   Heart: Regular rate and rhythm, normal S1 S2, no murmur, no rub, no gallop, PMI is normal size and placement, carotid upstroke normal without bruit, jugular venous pressure normal Abdomen: Soft, non-tender, non-distended with normoactive bowel sounds. No hepatosplenomegaly. Abdominal aorta is normal size without bruit Extremities: No edema, no clubbing, no cyanosis, no ulcers,  Peripheral: 2+ radial,  2+ femoral, 2+ dorsal pedal pulses Neuro: Alert and oriented. Moves all extremities spontaneously. Psych:  Responds to questions appropriately with a normal affect.   Intake/Output Summary (Last 24 hours) at 07/15/2017 1637 Last data filed at 07/15/2017 1057 Gross per 24 hour  Intake 1538.4 ml  Output 350 ml  Net 1188.4 ml    Inpatient Medications:  . [START ON 07/16/2017] aspirin  81 mg Oral Pre-Cath  . [MAR Hold] aspirin EC  325 mg Oral Daily  . [MAR Hold] atorvastatin  10 mg Oral Daily  . [MAR Hold] diltiazem  30 mg Oral BID  . [MAR Hold] docusate sodium  100 mg Oral BID  . [MAR Hold] enoxaparin (LOVENOX) injection  40 mg Subcutaneous Q24H  . [MAR Hold] ipratropium-albuterol  3 mL Nebulization TID  . [MAR Hold] isosorbide mononitrate  30 mg Oral Daily  . [MAR Hold] methylPREDNISolone (SOLU-MEDROL) injection  60 mg Intravenous Q12H  . [MAR Hold] oseltamivir  75 mg Oral BID  . [MAR Hold] pantoprazole  40 mg Oral Daily  . sodium chloride flush  3 mL Intravenous Q12H   Infusions:  . sodium chloride 75 mL/hr at 07/15/17 1031  . sodium chloride    . [START ON 07/16/2017] sodium chloride     Followed by  . [START ON 07/16/2017] sodium chloride      Labs: Recent Labs    07/13/17 0859 07/14/17 0500  NA 138 137  K 3.9 4.0  CL 105 110  CO2 21* 22  GLUCOSE 121* 161*  BUN 13 21*  CREATININE 0.92 0.84  CALCIUM 10.1 8.9  MG  --  1.8   Recent Labs    07/13/17 0859 07/14/17 0500  AST 32 20  ALT 28 20  ALKPHOS 89 57  BILITOT 0.9 0.5  PROT 7.4 5.6*  ALBUMIN 4.2 3.2*  Recent Labs    07/13/17 0859 07/14/17 0500  WBC 14.4* 8.6  NEUTROABS 10.4*  --   HGB 12.9* 10.3*  HCT 40.1 30.7*  MCV 78.6* 78.2*  PLT 285 230   Recent Labs    07/13/17 0859 07/13/17 1713 07/13/17 2250 07/14/17 0500  TROPONINI <0.03 <0.03 <0.03 <0.03   Invalid input(s): POCBNP No results for input(s): HGBA1C in the last 72 hours.   Weights: Filed Weights   07/13/17 0853 07/14/17 0701  07/15/17 0453  Weight: 147 lb (66.7 kg) 154 lb 11.2 oz (70.2 kg) 157 lb 13.6 oz (71.6 kg)     Radiology/Studies:  Ct Angio Chest Pe W And/or Wo Contrast  Result Date: 07/13/2017 CLINICAL DATA:  Left-sided chest pain and shortness of breath. EXAM: CT ANGIOGRAPHY CHEST WITH CONTRAST TECHNIQUE: Multidetector CT imaging of the chest was performed using the standard protocol during bolus administration of intravenous contrast. Multiplanar CT image reconstructions and MIPs were obtained to evaluate the vascular anatomy. CONTRAST:  75mL ISOVUE-370 IOPAMIDOL (ISOVUE-370) INJECTION 76% COMPARISON:  07/13/2017 FINDINGS: Cardiovascular: The heart size appears normal. Aortic atherosclerosis. Calcification within the LAD and left circumflex coronary arteries noted. No pericardial effusion. The main pulmonary artery appears patent. No saddle embolus or central obstructing emboli identified. No lobar or segmental pulmonary artery filling defects identified. Mediastinum/Nodes: Normal appearance of the thyroid gland. The trachea appears patent and is midline. Normal appearance of the esophagus. No axillary or supraclavicular adenopathy. Mild mediastinal adenopathy and hilar adenopathy identified. AP window lymph node measures 11 mm, image 41 of series 4. Subcarinal lymph node measures 1.4 cm. Left hilar lymph node measures 0.9 cm. Lungs/Pleura: No pleural effusion identified. Advanced changes of emphysema identified. Mild pneumonitis within the posterior and medial left lower lobe. No airspace consolidation or atelectasis identified. No pneumothorax. Within the left upper lobe there is a small nodule with spiculated margins measuring 8 mm, image 39 of series 9. Left lung perifissural nodule likely represents an intrapulmonary lymph node Upper Abdomen: No acute abnormality. Small stone identified within the dependent portion of the gallbladder. Musculoskeletal: No aggressive lytic or sclerotic bone lesions. Review of the MIP  images confirms the above findings. IMPRESSION: 1. No evidence for acute pulmonary embolus. 2. Left upper lobe pulmonary nodule measures 8 mm. Non-contrast chest CT at 6-12 months is recommended. If the nodule is stable at time of repeat CT, then future CT at 18-24 months (from today's scan) is considered optional for low-risk patients, but is recommended for high-risk patients. This recommendation follows the consensus statement: Guidelines for Management of Incidental Pulmonary Nodules Detected on CT Images:From the Fleischner Society 2017; published online before print (10.1148/[id number]). 3. Mild mediastinal and hilar adenopathy identified. 4. Aortic Atherosclerosis (ICD10-I70.0) and Emphysema (ICD10-J43.9). 5. Two vessel coronary artery atherosclerotic calcifications. Electronically Signed   By: Signa Kellaylor  Stroud M.D.   On: 07/13/2017 12:10   Dg Chest Portable 1 View  Result Date: 07/13/2017 CLINICAL DATA:  Chest pain and cough EXAM: PORTABLE CHEST 1 VIEW COMPARISON:  None. FINDINGS: 0919 hours. The lungs are clear without focal pneumonia, edema, pneumothorax or pleural effusion. Small pulmonary nodule identified left upper lobe. Interstitial markings are diffusely coarsened with chronic features. The cardio pericardial silhouette is enlarged. The visualized bony structures of the thorax are intact. Telemetry leads overlie the chest. IMPRESSION: 1. Left upper lobe pulmonary nodule. CT chest without contrast recommended to further evaluate. 2. Cardiomegaly with chronic interstitial changes. Electronically Signed   By: Kennith CenterEric  Mansell M.D.   On: 07/13/2017  09:35     Assessment and Recommendation  67 y.o. male with known chronic obstructive pulmonary disease with new onset flu as well as unstable angina without evidence of myocardial infarction with known coronary artery disease on previously appropriate medication management. Cardiac catheterization showing normal LV systolic function with ejection  fraction of 55% and patent stents with no evidence of significant stenosis requiring further intervention 1.  Continuation of supportive care of the flu and pulmonary issues 2.  High intensity cholesterol therapy 3.  Hypertension controlled 4.  Dual antiplatelet therapy 5.  No further cardiac diagnostic center intervention necessary at this time 6.  Okay for discharge to home from cardiac standpoint  Signed, Arnoldo Hooker M.D. FACC

## 2017-07-15 NOTE — Care Management (Signed)
Patient admitted with  COPD exacerbation.  [place]  PCP Manuella GhaziSuresh Thomas.  Patient states that he has portable Chronic O2.  Does not recall the name of the company which his O2 came from.  States "I wear it when I need it".  RNCM consult for home health needs.  Patient does not meet home bound criteria from home health.  Patient is independent, builds motorcycles for a hobby, and still rides his motorcycles.  Patient states "I wish my tank was a little smaller for when I went riding."  No RNCM needs identified.

## 2017-07-15 NOTE — Progress Notes (Signed)
Called 2C to get report from bedside nurse, nurse stated that patient had eaten lunch (there were no NPO orders) and that patient was flu positive. Paged and discussed these findings with Dr Gwen PoundsKowalski with orders to proceed with heart catherization. Notified cath lab of patient's flu status and that he had eaten lunch.

## 2017-07-15 NOTE — Progress Notes (Signed)
[facility] Encounter Note  Patient: Stephen Brady / Admit Date: 07/13/2017 / Date of Encounter: 07/15/2017, 1:26 PM   Subjective: Patient has had worsening episodes of chest discomfort substernal in nature requiring nitroglycerin multiple times over the night and unable to do any physical activity.  Shortness of breath not significantly changed from before.  No evidence of congestive heart failure.  Troponin still normal  Review of Systems: Positive for: Shortness of breath chest pain Negative for: Vision change, hearing change, syncope, dizziness, nausea, vomiting,diarrhea, bloody stool, stomach pain, cough, congestion, diaphoresis, urinary frequency, urinary pain,skin lesions, skin rashes Others previously listed  Objective: Telemetry: Sinus rhythm Physical Exam: Blood pressure 117/62, pulse 78, temperature 97.6 F (36.4 C), temperature source Oral, resp. rate (!) 22, height 5\' 7"  (1.702 m), weight 157 lb 13.6 oz (71.6 kg), SpO2 99 %. Body mass index is 24.72 kg/m. General: Well developed, well nourished, in no acute distress. Head: Normocephalic, atraumatic, sclera non-icteric, no xanthomas, nares are without discharge. Neck: No apparent masses Lungs: Normal respirations with use wheezes, some rhonchi, no rales , no crackles   Heart: Regular rate and rhythm, normal S1 S2, no murmur, no rub, no gallop, PMI is normal size and placement, carotid upstroke normal without bruit, jugular venous pressure normal Abdomen: Soft, non-tender, non-distended with normoactive bowel sounds. No hepatosplenomegaly. Abdominal aorta is normal size without bruit Extremities: No edema, no clubbing, no cyanosis, no ulcers,  Peripheral: 2+ radial, 2+ femoral, 2+ dorsal pedal pulses Neuro: Alert and oriented. Moves all extremities spontaneously. Psych:  Responds to questions appropriately with a normal affect.   Intake/Output Summary (Last 24 hours) at 07/15/2017 1326 Last data filed at  07/15/2017 1057 Gross per 24 hour  Intake 1762.4 ml  Output 450 ml  Net 1312.4 ml    Inpatient Medications:  . aspirin EC  325 mg Oral Daily  . atorvastatin  10 mg Oral Daily  . diltiazem  30 mg Oral BID  . docusate sodium  100 mg Oral BID  . enoxaparin (LOVENOX) injection  40 mg Subcutaneous Q24H  . ipratropium-albuterol  3 mL Nebulization TID  . isosorbide mononitrate  30 mg Oral Daily  . methylPREDNISolone (SOLU-MEDROL) injection  60 mg Intravenous Q12H  . oseltamivir  75 mg Oral BID  . pantoprazole  40 mg Oral Daily   Infusions:  . sodium chloride 75 mL/hr at 07/15/17 1031    Labs: Recent Labs    07/13/17 0859 07/14/17 0500  NA 138 137  K 3.9 4.0  CL 105 110  CO2 21* 22  GLUCOSE 121* 161*  BUN 13 21*  CREATININE 0.92 0.84  CALCIUM 10.1 8.9  MG  --  1.8   Recent Labs    07/13/17 0859 07/14/17 0500  AST 32 20  ALT 28 20  ALKPHOS 89 57  BILITOT 0.9 0.5  PROT 7.4 5.6*  ALBUMIN 4.2 3.2*   Recent Labs    07/13/17 0859 07/14/17 0500  WBC 14.4* 8.6  NEUTROABS 10.4*  --   HGB 12.9* 10.3*  HCT 40.1 30.7*  MCV 78.6* 78.2*  PLT 285 230   Recent Labs    07/13/17 0859 07/13/17 1713 07/13/17 2250 07/14/17 0500  TROPONINI <0.03 <0.03 <0.03 <0.03   Invalid input(s): POCBNP No results for input(s): HGBA1C in the last 72 hours.   Weights: Filed Weights   07/13/17 0853 07/14/17 0701 07/15/17 0453  Weight: 147 lb (66.7 kg) 154 lb 11.2 oz (70.2 kg) 157 lb 13.6 oz (71.6  kg)     Radiology/Studies:  Ct Angio Chest Pe W And/or Wo Contrast  Result Date: 07/13/2017 CLINICAL DATA:  Left-sided chest pain and shortness of breath. EXAM: CT ANGIOGRAPHY CHEST WITH CONTRAST TECHNIQUE: Multidetector CT imaging of the chest was performed using the standard protocol during bolus administration of intravenous contrast. Multiplanar CT image reconstructions and MIPs were obtained to evaluate the vascular anatomy. CONTRAST:  75mL ISOVUE-370 IOPAMIDOL (ISOVUE-370) INJECTION  76% COMPARISON:  07/13/2017 FINDINGS: Cardiovascular: The heart size appears normal. Aortic atherosclerosis. Calcification within the LAD and left circumflex coronary arteries noted. No pericardial effusion. The main pulmonary artery appears patent. No saddle embolus or central obstructing emboli identified. No lobar or segmental pulmonary artery filling defects identified. Mediastinum/Nodes: Normal appearance of the thyroid gland. The trachea appears patent and is midline. Normal appearance of the esophagus. No axillary or supraclavicular adenopathy. Mild mediastinal adenopathy and hilar adenopathy identified. AP window lymph node measures 11 mm, image 41 of series 4. Subcarinal lymph node measures 1.4 cm. Left hilar lymph node measures 0.9 cm. Lungs/Pleura: No pleural effusion identified. Advanced changes of emphysema identified. Mild pneumonitis within the posterior and medial left lower lobe. No airspace consolidation or atelectasis identified. No pneumothorax. Within the left upper lobe there is a small nodule with spiculated margins measuring 8 mm, image 39 of series 9. Left lung perifissural nodule likely represents an intrapulmonary lymph node Upper Abdomen: No acute abnormality. Small stone identified within the dependent portion of the gallbladder. Musculoskeletal: No aggressive lytic or sclerotic bone lesions. Review of the MIP images confirms the above findings. IMPRESSION: 1. No evidence for acute pulmonary embolus. 2. Left upper lobe pulmonary nodule measures 8 mm. Non-contrast chest CT at 6-12 months is recommended. If the nodule is stable at time of repeat CT, then future CT at 18-24 months (from today's scan) is considered optional for low-risk patients, but is recommended for high-risk patients. This recommendation follows the consensus statement: Guidelines for Management of Incidental Pulmonary Nodules Detected on CT Images:From the Fleischner Society 2017; published online before print  (10.1148/radiol.[phone] [id number]). 3. Mild mediastinal and hilar adenopathy identified. 4. Aortic Atherosclerosis (ICD10-I70.0) and Emphysema (ICD10-J43.9). 5. Two vessel coronary artery atherosclerotic calcifications. Electronically Signed   By: Signa Kellaylor  Stroud M.D.   On: 07/13/2017 12:10   Dg Chest Portable 1 View  Result Date: 07/13/2017 CLINICAL DATA:  Chest pain and cough EXAM: PORTABLE CHEST 1 VIEW COMPARISON:  None. FINDINGS: 0919 hours. The lungs are clear without focal pneumonia, edema, pneumothorax or pleural effusion. Small pulmonary nodule identified left upper lobe. Interstitial markings are diffusely coarsened with chronic features. The cardio pericardial silhouette is enlarged. The visualized bony structures of the thorax are intact. Telemetry leads overlie the chest. IMPRESSION: 1. Left upper lobe pulmonary nodule. CT chest without contrast recommended to further evaluate. 2. Cardiomegaly with chronic interstitial changes. Electronically Signed   By: Kennith CenterEric  Mansell M.D.   On: 07/13/2017 09:35     Assessment and Recommendation  67 y.o. male with known chronic obstructive pulmonary disease with new onset flu as well as unstable angina without evidence of myocardial infarction with known coronary artery disease on previously appropriate medication management. 1.  Proceed to cardiac catheterization to assess coronary anatomy and further treatment as necessary.  Patient understands risk and benefits of cardiac catheterization.  This includes the possibility of death stroke heart attack infection bleeding or blood clot.  He is at low risk for conscious sedation  Signed, Arnoldo HookerBruce Merlyn Conley M.D. FACC

## 2017-07-15 NOTE — Progress Notes (Signed)
Called and spoke with Dr. Caryn BeeMaier about patient complaining of chest pain, and not being able to sleep . Nitrro was given per patient request. New orders were given for norco 1 tab every 8hrs prn and xanax daily prn. Anselm Junglingonyers,Darren Nodal M

## 2017-07-15 NOTE — Progress Notes (Signed)
[facility] [place] [facility]   PATIENT NAME: Stephen Brady    MR#:  [id number]  DATE OF BIRTH:  06/29/1950  SUBJECTIVE:  CHIEF COMPLAINT:   Chief Complaint  Patient presents with  . Chest Pain  . Shortness of Breath  . Cough    Came with cough, chest pain and shortness of breath. Noted to have influenza.  Last night again had chest pain which did not let him sleep all night, it was on and off in his central chest which was relieved by using nitroglycerin tablets.  REVIEW OF SYSTEMS:  CONSTITUTIONAL: No fever, fatigue or weakness.  EYES: No blurred or double vision.  EARS, NOSE, AND THROAT: No tinnitus or ear pain.  RESPIRATORY: Positive for cough, shortness of breath, no wheezing or hemoptysis.  CARDIOVASCULAR: positive for chest pain, no orthopnea, edema.  GASTROINTESTINAL: No nausea, vomiting, diarrhea or abdominal pain.  GENITOURINARY: No dysuria, hematuria.  ENDOCRINE: No polyuria, nocturia,  HEMATOLOGY: No anemia, easy bruising or bleeding SKIN: No rash or lesion. MUSCULOSKELETAL: No joint pain or arthritis.   NEUROLOGIC: No tingling, numbness, weakness.  PSYCHIATRY: No anxiety or depression.   ROS  DRUG ALLERGIES:   Allergies  Allergen Reactions  . Penicillins Hives    Has patient had a PCN reaction causing immediate rash, facial/tongue/throat swelling, SOB or lightheadedness with hypotension: Unknown Has patient had a PCN reaction causing severe rash involving mucus membranes or skin necrosis: Unknown Has patient had a PCN reaction that required hospitalization: Unknown Has patient had a PCN reaction occurring within the last 10 years: Yes If all of the above answers are "NO", then may proceed with Cephalosporin use.    VITALS:  Blood pressure 123/60, pulse 81, temperature 98 F (36.7 C), temperature source Axillary, resp. rate 14, height 5\' 7"  (1.702 m), weight 71.6 kg (157 lb 13.6 oz), SpO2 93 %.  PHYSICAL EXAMINATION:  GENERAL:  67  y.o.-year-old patient lying in the bed with no acute distress.  EYES: Pupils equal, round, reactive to light and accommodation. No scleral icterus. Extraocular muscles intact.  HEENT: Head atraumatic, normocephalic. Oropharynx and nasopharynx clear.  NECK:  Supple, no jugular venous distention. No thyroid enlargement, no tenderness.  LUNGS: Normal breath sounds bilaterally, no wheezing, rales,rhonchi or crepitation. No use of accessory muscles of respiration. No tenderness on chest pressures. CARDIOVASCULAR: S1, S2 normal. No murmurs, rubs, or gallops.  ABDOMEN: Soft, nontender, nondistended. Bowel sounds present. No organomegaly or mass.  EXTREMITIES: No pedal edema, cyanosis, or clubbing.  NEUROLOGIC: Cranial nerves II through XII are intact. Muscle strength 5/5 in all extremities. Sensation intact. Gait not checked.  PSYCHIATRIC: The patient is alert and oriented x 3.  SKIN: No obvious rash, lesion, or ulcer.   Physical Exam LABORATORY PANEL:   CBC Recent Labs  Lab 07/14/17 0500  WBC 8.6  HGB 10.3*  HCT 30.7*  PLT 230   ------------------------------------------------------------------------------------------------------------------  Chemistries  Recent Labs  Lab 07/14/17 0500  NA 137  K 4.0  CL 110  CO2 22  GLUCOSE 161*  BUN 21*  CREATININE 0.84  CALCIUM 8.9  MG 1.8  AST 20  ALT 20  ALKPHOS 57  BILITOT 0.5   ------------------------------------------------------------------------------------------------------------------  Cardiac Enzymes Recent Labs  Lab 07/13/17 2250 07/14/17 0500  TROPONINI <0.03 <0.03   ------------------------------------------------------------------------------------------------------------------  RADIOLOGY:  No results found.  ASSESSMENT AND PLAN:   Principal Problem:   COPD exacerbation (HCC) Active Problems:   Influenza A   Acute respiratory distress  Chest pain, atypical  * Acute respiratory distress  COPD  exacerbation   Influenza A    IV steroid, change the frequency to twice a day.   Nebulizer treatment, tamiflu.   Admitting doctor has also started on levofloxacin, but patient doesn't seem to be having any bacterial infection, his symptoms can be explained by flu so I stopped Levaquin.   Continue supplemental oxygen for now and try to taper as tolerated.  * Atypical chest pain   Seen by cardiologist, appreciated help, no further workup at this time most likely secondary to infection. Advised to continue isosorbide and Cardizem.   3 troponins are negative.  Patient excessive cardiac history and his catheterization and stenting were done in [place], where he lives. Last night he again had recurrent chest pain which was subsided by nitroglycerin tablets so I spoke to cardiologist again on phone and he had agreed to do catheterization tomorrow for further evaluation.  * History of hypertension   Hold lisinopril for now because of blood pressures running normal.  * Elevated fibrinogen   CT chest angiogram was done on admission which ruled out pulmonary emboli.  All the records are reviewed and case discussed with Care Management/Social Workerr. Management plans discussed with the patient, family and they are in agreement.  CODE STATUS: full.  TOTAL TIME TAKING CARE OF THIS PATIENT: 45 minutes.    POSSIBLE D/C IN 1-2 DAYS, DEPENDING ON CLINICAL CONDITION.   Altamese Dilling M.D on 07/15/2017   Between 7am to 6pm - Pager - [phone]  After 6pm go to [email or web address] - password [email or web address]  [facility]  Office  [phone]  CC: Primary care physician; System, Pcp Not In  Note: This dictation was prepared with Dragon dictation along with smaller phrase technology. Any transcriptional errors that result from this process are unintentional.

## 2017-07-16 ENCOUNTER — Encounter: Payer: Self-pay | Admitting: Internal Medicine

## 2017-07-16 LAB — BASIC METABOLIC PANEL
Anion gap: 6 (ref 5–15)
BUN: 22 mg/dL — ABNORMAL HIGH (ref 6–20)
CALCIUM: 8.5 mg/dL — AB (ref 8.9–10.3)
CO2: 22 mmol/L (ref 22–32)
CREATININE: 0.73 mg/dL (ref 0.61–1.24)
Chloride: 111 mmol/L (ref 101–111)
Glucose, Bld: 140 mg/dL — ABNORMAL HIGH (ref 65–99)
Potassium: 4.4 mmol/L (ref 3.5–5.1)
SODIUM: 139 mmol/L (ref 135–145)

## 2017-07-16 LAB — GLUCOSE, CAPILLARY: GLUCOSE-CAPILLARY: 132 mg/dL — AB (ref 65–99)

## 2017-07-16 MED ORDER — ACETAMINOPHEN 325 MG PO TABS: 650.0000 mg | ORAL_TABLET | Freq: Four times a day (QID) | ORAL | 0 refills | Status: AC | PRN

## 2017-07-16 MED ORDER — OSELTAMIVIR PHOSPHATE 75 MG PO CAPS: 75.0000 mg | ORAL_CAPSULE | Freq: Two times a day (BID) | ORAL | 0 refills | Status: AC

## 2017-07-16 MED ORDER — PREDNISONE 10 MG (21) PO TBPK: ORAL_TABLET | ORAL | 0 refills | Status: DC

## 2017-07-16 NOTE — Progress Notes (Signed)
Pt discharged per MD order. IVs removed. Prescription given to pt. Pt instructed to follow up with [place] in [place] is from. Pt verbalized understanding. Discharge instructions reviewed with pt. Pt taken to car via wheelchair.

## 2017-07-16 NOTE — Care Management Important Message (Signed)
Important Message  Patient Details  Name: Honor JunesDale Brocksmith MRN: [id number] Date of Birth: 10/28/1950   Medicare Important Message Given:  Yes    Chapman FitchBOWEN, Kanitra Purifoy T, RN 07/16/2017, 10:12 AM

## 2017-07-16 NOTE — Discharge Instructions (Signed)
Follow with PCP in 2-3 weeks.

## 2017-07-18 LAB — CULTURE, BLOOD (ROUTINE X 2)
Culture: NO GROWTH
Culture: NO GROWTH

## 2017-07-19 ENCOUNTER — Telehealth: Payer: Self-pay

## 2017-07-19 NOTE — Telephone Encounter (Signed)
EMMI follow-up: Received a VM from this patient stating he had just received a call from this number. I returned the call and explained we had a new process of automated calls  checking to see if patients had any discharge concerns.   He said [facility] [place] but he is here visiting with his mother and could the physician [facility] [place]?  He also stated he  needed some medication for the congestion as we was unable to sleep.  Following up with Florina OuJennifer Gammond, Administrative Asst. for [facility] to contact the physician regarding this request.  Provided MR & pt. Phone number. Will await a reply.

## 2017-07-19 NOTE — Telephone Encounter (Signed)
EMMI Follow-up: Dr. Madelon LipsVacchani called the patient and sent Rxs to local pharmacy today.

## 2017-07-19 NOTE — Discharge Summary (Signed)
[facility] [place] [facility]   PATIENT NAME: Stephen Brady    MR#:  [id number]  DATE OF BIRTH:  30-Jun-1950  DATE OF ADMISSION:  07/13/2017 ADMITTING PHYSICIAN: Marguarite Arbour, MD  DATE OF DISCHARGE: 07/16/2017 12:34 PM  PRIMARY CARE PHYSICIAN: System, Pcp Not In    ADMISSION DIAGNOSIS:  Shortness of breath [R06.02] COPD exacerbation (HCC) [J44.1] Influenza [J11.1] Community acquired pneumonia of left lower lobe of lung (HCC) [J18.1]  DISCHARGE DIAGNOSIS:  Principal Problem:   COPD exacerbation (HCC) Active Problems:   Influenza A   Acute respiratory distress   Chest pain, atypical   SECONDARY DIAGNOSIS:   Past Medical History:  Diagnosis Date  . Asthma   . COPD (chronic obstructive pulmonary disease) (HCC)   . Coronary artery disease   . Hypertension     HOSPITAL COURSE:   * Acute respiratory distress  COPD exacerbation   Influenza A    IV steroid, change the frequency to twice a day.   Nebulizer treatment, tamiflu.   Admitting doctor has also started on levofloxacin, but patient doesn't seem to be having any     bacterial infection, his symptoms can be explained by flu so I stopped Levaquin.   Continue supplemental oxygen for now and try to taper as tolerated. Tapered off to room air.  * Atypical chest pain   Seen by cardiologist, appreciated help, no further workup at this time most likely secondary to infection. Advised to continue isosorbide and Cardizem.   3 troponins are negative.  Patient excessive cardiac history and his catheterization and stenting were done in [place], where he lives. Last night he again had recurrent chest pain which was subsided by nitroglycerin tablets so I spoke to cardiologist again on phone and he had agreed to do catheterization , which did not show any new lesions, and cardiologist suggest to d/c home.  * History of hypertension   Hold lisinopril for now because of blood pressures running  normal.  * Elevated fibrinogen   CT chest angiogram was done on admission which ruled out pulmonary emboli.    DISCHARGE CONDITIONS:   Stable.  CONSULTS OBTAINED:  Treatment Team:  Yevonne Pax, MD Lamar Blinks, MD  DRUG ALLERGIES:   Allergies  Allergen Reactions  . Penicillins Hives    Has patient had a PCN reaction causing immediate rash, facial/tongue/throat swelling, SOB or lightheadedness with hypotension: Unknown Has patient had a PCN reaction causing severe rash involving mucus membranes or skin necrosis: Unknown Has patient had a PCN reaction that required hospitalization: Unknown Has patient had a PCN reaction occurring within the last 10 years: Yes If all of the above answers are "NO", then may proceed with Cephalosporin use.    DISCHARGE MEDICATIONS:   Allergies as of 07/16/2017      Reactions   Penicillins Hives   Has patient had a PCN reaction causing immediate rash, facial/tongue/throat swelling, SOB or lightheadedness with hypotension: Unknown Has patient had a PCN reaction causing severe rash involving mucus membranes or skin necrosis: Unknown Has patient had a PCN reaction that required hospitalization: Unknown Has patient had a PCN reaction occurring within the last 10 years: Yes If all of the above answers are "NO", then may proceed with Cephalosporin use.      Medication List    TAKE these medications   acetaminophen 325 MG tablet Commonly known as:  TYLENOL Take 2 tablets (650 mg total) by mouth every 6 (six) hours  as needed for mild pain (or Fever >/= 101).   aspirin 325 MG tablet Take 325 mg by mouth daily.   atorvastatin 10 MG tablet Commonly known as:  LIPITOR Take 10 mg by mouth daily.   diltiazem 30 MG tablet Commonly known as:  CARDIZEM Take 30 mg by mouth 2 (two) times daily.   isosorbide mononitrate 30 MG 24 hr tablet Commonly known as:  IMDUR Take 30 mg by mouth daily.   lisinopril 2.5 MG tablet Commonly known as:   PRINIVIL,ZESTRIL Take 2.5 mg by mouth daily.   nitroGLYCERIN 0.4 MG SL tablet Commonly known as:  NITROSTAT Place 0.4 mg under the tongue every 5 (five) minutes as needed for chest pain.   pantoprazole 40 MG tablet Commonly known as:  PROTONIX Take 40 mg by mouth daily.   predniSONE 10 MG (21) Tbpk tablet Commonly known as:  STERAPRED UNI-PAK 21 TAB Take 6 tabs first day, 5 tab on day 2, then 4 on day 3rd, 3 tabs on day 4th , 2 tab on day 5th, and 1 tab on 6th day.     ASK your doctor about these medications   oseltamivir 75 MG capsule Commonly known as:  TAMIFLU Take 1 capsule (75 mg total) by mouth 2 (two) times daily for 2 days. Ask about: Should I take this medication?        DISCHARGE INSTRUCTIONS:    Follow with PMD in 2 weeks.  If you experience worsening of your admission symptoms, develop shortness of breath, life threatening emergency, suicidal or homicidal thoughts you must seek medical attention immediately by calling 911 or calling your MD immediately  if symptoms less severe.  You Must read complete instructions/literature along with all the possible adverse reactions/side effects for all the Medicines you take and that have been prescribed to you. Take any new Medicines after you have completely understood and accept all the possible adverse reactions/side effects.   Please note  You were cared for by a hospitalist during your hospital stay. If you have any questions about your discharge medications or the care you received while you were in the hospital after you are discharged, you can call the unit and asked to speak with the hospitalist on call if the hospitalist that took care of you is not available. Once you are discharged, your primary care physician will handle any further medical issues. Please note that NO REFILLS for any discharge medications will be authorized once you are discharged, as it is imperative that you return to your primary care physician  (or establish a relationship with a primary care physician if you do not have one) for your aftercare needs so that they can reassess your need for medications and monitor your lab values.    Today   CHIEF COMPLAINT:   Chief Complaint  Patient presents with  . Chest Pain  . Shortness of Breath  . Cough    HISTORY OF PRESENT ILLNESS:  Tighe Gitto  is a 67 y.o. male with a known history of HTN, COPD, and CAD with nocturnal O2 at home now with left-sided chest and rib pain with cough and SOB. Influenza A positive in ER. Chest CT negative for pneumonia or PE. Hypoxic on RA despite IV steroids and SVN's x 3. Still with left-sided CP. He is now admitted. Has had fevers at home. No N/V/D.     VITAL SIGNS:  Blood pressure 135/68, pulse 65, temperature 97.7 F (36.5 C), temperature source Oral, resp.  rate (!) 22, height 5\' 7"  (1.702 m), weight 71.6 kg (157 lb 13.6 oz), SpO2 93 %.  I/O:  No intake or output data in the 24 hours ending 07/19/17 1437  PHYSICAL EXAMINATION:  GENERAL:  67 y.o.-year-old patient lying in the bed with no acute distress.  EYES: Pupils equal, round, reactive to light and accommodation. No scleral icterus. Extraocular muscles intact.  HEENT: Head atraumatic, normocephalic. Oropharynx and nasopharynx clear.  NECK:  Supple, no jugular venous distention. No thyroid enlargement, no tenderness.  LUNGS: Normal breath sounds bilaterally, no wheezing, rales,rhonchi or crepitation. No use of accessory muscles of respiration.  CARDIOVASCULAR: S1, S2 normal. No murmurs, rubs, or gallops.  ABDOMEN: Soft, non-tender, non-distended. Bowel sounds present. No organomegaly or mass.  EXTREMITIES: No pedal edema, cyanosis, or clubbing.  NEUROLOGIC: Cranial nerves II through XII are intact. Muscle strength 5/5 in all extremities. Sensation intact. Gait not checked.  PSYCHIATRIC: The patient is alert and oriented x 3.  SKIN: No obvious rash, lesion, or ulcer.   DATA REVIEW:    CBC Recent Labs  Lab 07/14/17 0500  WBC 8.6  HGB 10.3*  HCT 30.7*  PLT 230    Chemistries  Recent Labs  Lab 07/14/17 0500 07/16/17 0550  NA 137 139  K 4.0 4.4  CL 110 111  CO2 22 22  GLUCOSE 161* 140*  BUN 21* 22*  CREATININE 0.84 0.73  CALCIUM 8.9 8.5*  MG 1.8  --   AST 20  --   ALT 20  --   ALKPHOS 57  --   BILITOT 0.5  --     Cardiac Enzymes Recent Labs  Lab 07/14/17 0500  TROPONINI <0.03    Microbiology Results  Results for orders placed or performed during the hospital encounter of 07/13/17  Culture, blood (routine x 2)     Status: None   Collection Time: 07/13/17  3:00 PM  Result Value Ref Range Status   Specimen Description BLOOD BLOOD RIGHT FOREARM  Final   Special Requests   Final    BOTTLES DRAWN AEROBIC AND ANAEROBIC Blood Culture results may not be optimal due to an excessive volume of blood received in culture bottles   Culture   Final    NO GROWTH 5 DAYS Performed at [facility], [address] [place], [place] [address]    Report Status 07/18/2017 FINAL  Final  Culture, blood (routine x 2)     Status: None   Collection Time: 07/13/17  3:00 PM  Result Value Ref Range Status   Specimen Description BLOOD LEFT ANTECUBITAL  Final   Special Requests   Final    BOTTLES DRAWN AEROBIC AND ANAEROBIC Blood Culture results may not be optimal due to an excessive volume of blood received in culture bottles   Culture   Final    NO GROWTH 5 DAYS Performed at [facility], [address] [place], [place] [address]    Report Status 07/18/2017 FINAL  Final    RADIOLOGY:  No results found.  EKG:   Orders placed or performed during the hospital encounter of 07/13/17  . EKG 12-Lead  . EKG 12-Lead  . ED EKG  . ED EKG      Management plans discussed with the patient, family and they are in agreement.  CODE STATUS:  Code Status History    Date Active Date Inactive Code Status Order ID Comments User Context   07/13/2017  17:06 07/16/2017 15:35 Full Code [id number]  Sparks,  Duane Lope, MD Inpatient      TOTAL TIME TAKING CARE OF THIS PATIENT: 35 minutes.    Altamese Dilling M.D on 07/19/2017 at 2:37 PM  Between 7am to 6pm - Pager - [phone]  After 6pm go to [email or web address] - password [email or web address]  [facility]  Office  [phone]  CC: Primary care physician; System, Pcp Not In   Note: This dictation was prepared with Dragon dictation along with smaller phrase technology. Any transcriptional errors that result from this process are unintentional.

## 2017-07-22 ENCOUNTER — Telehealth: Payer: Self-pay

## 2017-07-22 NOTE — Telephone Encounter (Signed)
EMMI Follow-up: Returned call to patient and he said he had received another automated call again today even though medication situation was taken care of on Friday, 2/22.  He was just checking to see why we called again.  Explained he may receive a series of calls and to listen for prompts if he wishes to opt out of further calls.

## 2017-07-23 ENCOUNTER — Telehealth: Payer: Self-pay

## 2017-07-23 NOTE — Telephone Encounter (Signed)
CSW contacted patient in reference to [facility]. Patient denies being hopeless and states that he is having no issues with coping or feelings of hopelessness. Patient has no concerns at this time.  York SpanielMonica Lataisha Colan MSW,LCSW

## 2017-07-29 ENCOUNTER — Emergency Department: Payer: Medicare Other

## 2017-07-29 ENCOUNTER — Other Ambulatory Visit: Payer: Self-pay

## 2017-07-29 ENCOUNTER — Emergency Department
Admission: EM | Admit: 2017-07-29 | Discharge: 2017-07-29 | Disposition: A | Discharge status: Home / Self Care | Payer: Medicare Other | Attending: Emergency Medicine | Admitting: Emergency Medicine

## 2017-07-29 ENCOUNTER — Encounter: Payer: Self-pay | Admitting: Emergency Medicine

## 2017-07-29 DIAGNOSIS — J441 Chronic obstructive pulmonary disease with (acute) exacerbation: Secondary | ICD-10-CM | POA: Insufficient documentation

## 2017-07-29 DIAGNOSIS — R079 Chest pain, unspecified: Secondary | ICD-10-CM | POA: Diagnosis present

## 2017-07-29 DIAGNOSIS — Z7982 Long term (current) use of aspirin: Secondary | ICD-10-CM | POA: Diagnosis not present

## 2017-07-29 DIAGNOSIS — Z79899 Other long term (current) drug therapy: Secondary | ICD-10-CM | POA: Insufficient documentation

## 2017-07-29 DIAGNOSIS — I2510 Atherosclerotic heart disease of native coronary artery without angina pectoris: Secondary | ICD-10-CM | POA: Diagnosis not present

## 2017-07-29 DIAGNOSIS — Z87891 Personal history of nicotine dependence: Secondary | ICD-10-CM | POA: Diagnosis not present

## 2017-07-29 DIAGNOSIS — I10 Essential (primary) hypertension: Secondary | ICD-10-CM | POA: Insufficient documentation

## 2017-07-29 LAB — BASIC METABOLIC PANEL
ANION GAP: 7 (ref 5–15)
BUN: 13 mg/dL (ref 6–20)
CO2: 27 mmol/L (ref 22–32)
CREATININE: 0.92 mg/dL (ref 0.61–1.24)
Calcium: 9.3 mg/dL (ref 8.9–10.3)
Chloride: 105 mmol/L (ref 101–111)
GLUCOSE: 92 mg/dL (ref 65–99)
Potassium: 4 mmol/L (ref 3.5–5.1)
Sodium: 139 mmol/L (ref 135–145)

## 2017-07-29 LAB — CBC
HEMATOCRIT: 34.2 % — AB (ref 40.0–52.0)
Hemoglobin: 11.2 g/dL — ABNORMAL LOW (ref 13.0–18.0)
MCH: 25.8 pg — AB (ref 26.0–34.0)
MCHC: 32.7 g/dL (ref 32.0–36.0)
MCV: 78.9 fL — ABNORMAL LOW (ref 80.0–100.0)
Platelets: 275 10*3/uL (ref 150–440)
RBC: 4.34 MIL/uL — ABNORMAL LOW (ref 4.40–5.90)
RDW: 17.3 % — ABNORMAL HIGH (ref 11.5–14.5)
WBC: 8.9 10*3/uL (ref 3.8–10.6)

## 2017-07-29 LAB — TROPONIN I
Troponin I: <0.03 ng/mL (ref <0.03)
Troponin I: <0.03 ng/mL (ref <0.03)

## 2017-07-29 MED ORDER — IPRATROPIUM-ALBUTEROL 0.5-2.5 (3) MG/3ML IN SOLN
3.0000 mL | Freq: Once | RESPIRATORY_TRACT | Status: AC
Start: 2017-07-29 — End: 2017-07-29
  Administered 2017-07-29: 3 mL via RESPIRATORY_TRACT

## 2017-07-29 MED ORDER — NITROGLYCERIN 0.4 MG SL SUBL
0.4000 mg | SUBLINGUAL_TABLET | SUBLINGUAL | Status: DC | PRN
  Administered 2017-07-29: 0.4 mg via SUBLINGUAL
  Filled 2017-07-29: qty 1

## 2017-07-29 MED ORDER — IOPAMIDOL (ISOVUE-370) INJECTION 76%
75.0000 mL | Freq: Once | INTRAVENOUS | Status: AC | PRN
  Administered 2017-07-29: 75 mL via INTRAVENOUS

## 2017-07-29 MED ORDER — IPRATROPIUM BROMIDE HFA 17 MCG/ACT IN AERS: 1.0000 | INHALATION_SPRAY | Freq: Three times a day (TID) | RESPIRATORY_TRACT | 2 refills | Status: AC

## 2017-07-29 MED ORDER — PREDNISONE 20 MG PO TABS: 60.0000 mg | ORAL_TABLET | Freq: Every day | ORAL | 0 refills | Status: AC

## 2017-07-29 MED ORDER — IPRATROPIUM-ALBUTEROL 0.5-2.5 (3) MG/3ML IN SOLN
RESPIRATORY_TRACT | Status: AC
  Filled 2017-07-29: qty 3

## 2017-07-29 MED ORDER — PREDNISONE 20 MG PO TABS
60.0000 mg | ORAL_TABLET | Freq: Once | ORAL | Status: AC
  Administered 2017-07-29: 60 mg via ORAL
  Filled 2017-07-29: qty 3

## 2017-07-29 MED ORDER — ALBUTEROL SULFATE HFA 108 (90 BASE) MCG/ACT IN AERS: 2.0000 | INHALATION_SPRAY | Freq: Four times a day (QID) | RESPIRATORY_TRACT | 2 refills | Status: AC | PRN

## 2017-07-29 NOTE — ED Provider Notes (Signed)
[facility] Emergency Department Provider Note  ____________________________________________  Time seen: Approximately 4:48 PM  I have reviewed the triage vital signs and the nursing notes.   HISTORY  Chief Complaint Chest Pain   HPI Stephen Brady is a 67 y.o. male with a history of asthma, COPD, CAD, hypertension who presents for evaluation of chest pain. Patient reports 3 days of left-sided chest pain that he describes as tightness, worse at nighttime, currently 6 out of 10, nonradiating. He does report that the pain is worse with deep inspiration. He denies any changes on his chronic cough, fever or chills, vomiting or diarrhea, diaphoresis. He does endorse mild shortness of breath associated with this pain. He reports that he hasn't been able to sleep the last 2 nights because the pain is severe. The pain usually resolves with nitroglycerin.patient was admitted to the hospital 3 weeks ago for flu and chest pain. During that admission patient underwent a left heart catheterization which showed minimal coronary artery disease and patent stent.patient continues to smoke. He denies any prior history of PE or DVT.   Past Medical History:  Diagnosis Date  . Asthma   . COPD (chronic obstructive pulmonary disease) (HCC)   . Coronary artery disease   . Hypertension     Patient Active Problem List   Diagnosis Date Noted  . Influenza A 07/13/2017  . Acute respiratory distress 07/13/2017  . COPD exacerbation (HCC) 07/13/2017  . Chest pain, atypical 07/13/2017    Past Surgical History:  Procedure Laterality Date  . LEFT HEART CATH AND CORONARY ANGIOGRAPHY N/A 07/15/2017   Procedure: LEFT HEART CATH AND CORONARY ANGIOGRAPHY;  Surgeon: Lamar Blinks, MD;  Location: [facility] INVASIVE CV LAB;  Service: Cardiovascular;  Laterality: N/A;    Prior to Admission medications   Medication Sig Start Date End Date Taking? Authorizing Provider  acetaminophen (TYLENOL) 325 MG  tablet Take 2 tablets (650 mg total) by mouth every 6 (six) hours as needed for mild pain (or Fever >/= 101). 07/16/17  Yes Altamese Dilling, MD  aspirin 325 MG tablet Take 325 mg by mouth daily.   Yes [provider]  atorvastatin (LIPITOR) 10 MG tablet Take 10 mg by mouth daily.   Yes [provider]  diltiazem (CARDIZEM) 30 MG tablet Take 30 mg by mouth 2 (two) times daily.   Yes [provider]  isosorbide mononitrate (IMDUR) 30 MG 24 hr tablet Take 30 mg by mouth daily.   Yes [provider]  lisinopril (PRINIVIL,ZESTRIL) 2.5 MG tablet Take 2.5 mg by mouth daily.   Yes [provider]  nitroGLYCERIN (NITROSTAT) 0.4 MG SL tablet Place 0.4 mg under the tongue every 5 (five) minutes as needed for chest pain.   Yes [provider]  pantoprazole (PROTONIX) 40 MG tablet Take 40 mg by mouth daily.   Yes [provider]  albuterol (PROVENTIL HFA;VENTOLIN HFA) 108 (90 Base) MCG/ACT inhaler Inhale 2 puffs into the lungs every 6 (six) hours as needed for wheezing or shortness of breath. 07/29/17   Nita Sickle, MD  ipratropium (ATROVENT HFA) 17 MCG/ACT inhaler Inhale 1 puff into the lungs 3 (three) times daily. 07/29/17 07/29/18  Nita Sickle, MD  predniSONE (DELTASONE) 20 MG tablet Take 3 tablets (60 mg total) by mouth daily for 4 days. 07/29/17 08/02/17  Nita Sickle, MD    Allergies Penicillins  No family history on file.  Social History Social History   Tobacco Use  . Smoking status: Former  Smoker    Last attempt to quit: 07/13/2008    Years since quitting: 9.0  . Smokeless tobacco: Never Used  Substance Use Topics  . Alcohol use: No    Frequency: Never  . Drug use: No    Review of Systems  Constitutional: Negative for fever. Eyes: Negative for visual changes. ENT: Negative for sore throat. Neck: No neck pain  Cardiovascular: + chest pain. Respiratory: + shortness of breath. Gastrointestinal: Negative for  abdominal pain, vomiting or diarrhea. Genitourinary: Negative for dysuria. Musculoskeletal: Negative for back pain. Skin: Negative for rash. Neurological: Negative for headaches, weakness or numbness. Psych: No SI or HI  ____________________________________________   PHYSICAL EXAM:  VITAL SIGNS: ED Triage Vitals  Enc Vitals Group     BP 07/29/17 1234 128/72     Pulse Rate 07/29/17 1234 64     Resp 07/29/17 1234 16     Temp 07/29/17 1234 98.1 F (36.7 C)     Temp Source 07/29/17 1234 Oral     SpO2 07/29/17 1234 98 %     Weight 07/29/17 1232 157 lb (71.2 kg)     Height 07/29/17 1232 5\' 7"  (1.702 m)     Head Circumference --      Peak Flow --      Pain Score 07/29/17 1232 8     Pain Loc --      Pain Edu? --      Excl. in GC? --     Constitutional: Alert and oriented. Well appearing and in no apparent distress. HEENT:      Head: Normocephalic and atraumatic.         Eyes: Conjunctivae are normal. Sclera is non-icteric.       Mouth/Throat: Mucous membranes are moist.       Neck: Supple with no signs of meningismus. Cardiovascular: Regular rate and rhythm. No murmurs, gallops, or rubs. 2+ symmetrical distal pulses are present in all extremities. No JVD. Respiratory: Normal respiratory effort. Lungs with slightly decreased air movement and with faint expiratory wheezing .  Gastrointestinal: Soft, non tender, and non distended with positive bowel sounds. No rebound or guarding. Musculoskeletal: Nontender with normal range of motion in all extremities. No edema, cyanosis, or erythema of extremities. Neurologic: Normal speech and language. Face is symmetric. Moving all extremities. No gross focal neurologic deficits are appreciated. Skin: Skin is warm, dry and intact. No rash noted. Psychiatric: Mood and affect are normal. Speech and behavior are normal.  ____________________________________________   LABS (all labs ordered are listed, but only abnormal results are  displayed)  Labs Reviewed  CBC - Abnormal; Notable for the following components:      Result Value   RBC 4.34 (*)    Hemoglobin 11.2 (*)    HCT 34.2 (*)    MCV 78.9 (*)    MCH 25.8 (*)    RDW 17.3 (*)    All other components within normal limits  BASIC METABOLIC PANEL  TROPONIN I  TROPONIN I   ____________________________________________  EKG  ED ECG REPORT I, Nita Sickle, the attending physician, personally viewed and interpreted this ECG.  Normal sinus rhythm, rate of 68, normal intervals, left axis deviation, no ST elevations or depressions. Unchanged from prior ____________________________________________  RADIOLOGY  I have personally reviewed the images performed during this visit and I agree with the Radiologist's read.   Interpretation by Radiologist:  Dg Chest 2 View  Result Date: 07/29/2017 CLINICAL DATA:  Mid chest pain for 2 or 3  months. History of COPD, coronary artery disease and hypertension. EXAM: CHEST  2 VIEW COMPARISON:  Radiographs and CT 07/13/2017. FINDINGS: The heart size and mediastinal contours are stable. Aortic atherosclerosis and coronary artery stents are noted. Aeration of the lung bases has improved with mild residual atelectasis or scarring. Previously noted left upper lobe nodule is grossly stable. There is no edema, confluent airspace opacity, pneumothorax or significant pleural effusion. There are stable mild degenerative changes in the spine. IMPRESSION: Interval improved aeration of the lung bases. No acute cardiopulmonary process. Grossly stable left upper lobe nodule for which follow-up chest CT has been recommend. Electronically Signed   By: Carey BullocksWilliam  Veazey M.D.   On: 07/29/2017 13:22   Ct Angio Chest Pe W And/or Wo Contrast  Result Date: 07/29/2017 CLINICAL DATA:  Recent hospitalization for pneumonia, heart catheterization, hurting since catheterization, history of coronary disease post MI, COPD, hypertension, smoker EXAM: CT  ANGIOGRAPHY CHEST WITH CONTRAST TECHNIQUE: Multidetector CT imaging of the chest was performed using the standard protocol during bolus administration of intravenous contrast. Multiplanar CT image reconstructions and MIPs were obtained to evaluate the vascular anatomy. CONTRAST:  75mL ISOVUE-370 IOPAMIDOL (ISOVUE-370) INJECTION 76% IV COMPARISON:  07/13/2017 FINDINGS: Cardiovascular: Atherosclerotic calcification aorta and coronary arteries. Probable coronary stenting. Ascending thoracic aorta upper normal caliber 3.8 cm diameter. No aortic aneurysm or dissection. Pulmonary arteries well opacified and patent. No evidence of pulmonary embolism. Heart unremarkable. No pericardial effusion. Mediastinum/Nodes: Esophagus normal appearance. Base of cervical region unremarkable. No thoracic adenopathy. Lungs/Pleura: Emphysematous changes with minimal biapical scarring. Stellate density 8 mm diameter LEFT upper lobe unchanged. Linear subsegmental atelectasis in lower lobes. No acute infiltrate, pleural effusion, or pneumothorax. Upper Abdomen: Dependent calcified gallstone in gallbladder. Remaining visualized upper abdomen unremarkable for technique Musculoskeletal: No acute osseous findings. Review of the MIP images confirms the above findings. IMPRESSION: No evidence of pulmonary embolism. Cholelithiasis. 8 mm stellate density in the LEFT upper lobe, unchanged; recommendation below. Non-contrast chest CT at 6-12 months is recommended. If the nodule is stable at time of repeat CT, then future CT at 18-24 months (from today's scan) is considered optional for low-risk patients, but is recommended for high-risk patients. This recommendation follows the consensus statement: Guidelines for Management of Incidental Pulmonary Nodules Detected on CT Images: From the Fleischner Society 2017; Radiology 2017; 284:228-243. Aortic Atherosclerosis (ICD10-I70.0) and Emphysema (ICD10-J43.9). Electronically Signed   By: Ulyses SouthwardMark  Boles M.D.    On: 07/29/2017 16:46     ____________________________________________   PROCEDURES  Procedure(s) performed: None Procedures Critical Care performed:  None ____________________________________________   INITIAL IMPRESSION / ASSESSMENT AND PLAN / ED COURSE  67 y.o. male with a history of asthma, COPD, CAD, hypertension who presents for evaluation of intermittent chest tightness for the last 3 days worse at nighttime. Pain resolved with nitro. EKG with no evidence of ischemia. Troponin is negative 1, second is pending. Patient underwent LHC on to/18/19 showing minimal coronary artery disease and patent stent. Lungs with slightly decreased air movement and faint expiratory wheezes. patient received one sublingual nitroglycerin with no change in the pain. He then received 1 DuoNeb treatment with full resolution of his symptoms, now moving great air with no other episodes of wheezing. CTA showing of this of pneumonia, pericardial effusion, or pulmonary embolism. Troponin 2 is negative. Patient be provided inhalers and steroids. Discussed return precautions and follow up with primary care doctor.     As part of my medical decision making, I reviewed the following data within the  electronic MEDICAL RECORD NUMBER Nursing notes reviewed and incorporated, Labs reviewed , EKG interpreted , Old EKG reviewed, Radiograph reviewed , Notes from prior ED visits and [place] Controlled Substance Database    Pertinent labs & imaging results that were available during my care of the patient were reviewed by me and considered in my medical decision making (see chart for details).    ____________________________________________   FINAL CLINICAL IMPRESSION(S) / ED DIAGNOSES  Final diagnoses:  COPD exacerbation (HCC)      NEW MEDICATIONS STARTED DURING THIS VISIT:  ED Discharge Orders        Ordered    predniSONE (DELTASONE) 20 MG tablet  Daily     07/29/17 1738    ipratropium (ATROVENT HFA) 17 MCG/ACT  inhaler  3 times daily     07/29/17 1738    albuterol (PROVENTIL HFA;VENTOLIN HFA) 108 (90 Base) MCG/ACT inhaler  Every 6 hours PRN     07/29/17 1738       Note:  This document was prepared using Dragon voice recognition software and may include unintentional dictation errors.    Don Perking, [place], MD 07/29/17 [phone]

## 2017-07-29 NOTE — ED Triage Notes (Signed)
C/O chest pain x 1 week.  Patient was recently hospitalized for pneumonia and flu and had a heart catheterization during inpatient stay.  Patient states he heart has been hurting since catheterization.

## 2017-07-29 NOTE — Discharge Instructions (Signed)

## 2017-07-29 NOTE — ED Notes (Signed)
Pt discharged to home.  Family member driving.  Discharge instructions reviewed.  Verbalized understanding.  No questions or concerns at this time.  Teach back verified.  Pt in NAD.  No items left in ED.   

## 2017-08-06 ENCOUNTER — Encounter: Payer: Self-pay | Admitting: Emergency Medicine

## 2017-08-06 ENCOUNTER — Emergency Department: Payer: Medicare Other

## 2017-08-06 ENCOUNTER — Emergency Department
Admission: EM | Admit: 2017-08-06 | Discharge: 2017-08-06 | Disposition: A | Discharge status: Home / Self Care | Payer: Medicare Other | Attending: Emergency Medicine | Admitting: Emergency Medicine

## 2017-08-06 ENCOUNTER — Other Ambulatory Visit: Payer: Self-pay

## 2017-08-06 DIAGNOSIS — I10 Essential (primary) hypertension: Secondary | ICD-10-CM | POA: Diagnosis not present

## 2017-08-06 DIAGNOSIS — Z87891 Personal history of nicotine dependence: Secondary | ICD-10-CM | POA: Diagnosis not present

## 2017-08-06 DIAGNOSIS — I2510 Atherosclerotic heart disease of native coronary artery without angina pectoris: Secondary | ICD-10-CM | POA: Diagnosis not present

## 2017-08-06 DIAGNOSIS — Z7982 Long term (current) use of aspirin: Secondary | ICD-10-CM | POA: Insufficient documentation

## 2017-08-06 DIAGNOSIS — Z79899 Other long term (current) drug therapy: Secondary | ICD-10-CM | POA: Diagnosis not present

## 2017-08-06 DIAGNOSIS — J40 Bronchitis, not specified as acute or chronic: Secondary | ICD-10-CM | POA: Diagnosis not present

## 2017-08-06 DIAGNOSIS — J441 Chronic obstructive pulmonary disease with (acute) exacerbation: Secondary | ICD-10-CM | POA: Insufficient documentation

## 2017-08-06 DIAGNOSIS — R0602 Shortness of breath: Secondary | ICD-10-CM | POA: Diagnosis present

## 2017-08-06 LAB — BASIC METABOLIC PANEL
ANION GAP: 6 (ref 5–15)
BUN: 18 mg/dL (ref 6–20)
CHLORIDE: 105 mmol/L (ref 101–111)
CO2: 25 mmol/L (ref 22–32)
Calcium: 9.1 mg/dL (ref 8.9–10.3)
Creatinine, Ser: 0.97 mg/dL (ref 0.61–1.24)
GFR calc non Af Amer: >60 mL/min (ref >60)
Glucose, Bld: 94 mg/dL (ref 65–99)
POTASSIUM: 4.4 mmol/L (ref 3.5–5.1)
SODIUM: 136 mmol/L (ref 135–145)

## 2017-08-06 LAB — CBC
HEMATOCRIT: 35.6 % — AB (ref 40.0–52.0)
Hemoglobin: 11.3 g/dL — ABNORMAL LOW (ref 13.0–18.0)
MCH: 25.1 pg — ABNORMAL LOW (ref 26.0–34.0)
MCHC: 31.8 g/dL — ABNORMAL LOW (ref 32.0–36.0)
MCV: 78.8 fL — AB (ref 80.0–100.0)
Platelets: 348 10*3/uL (ref 150–440)
RBC: 4.51 MIL/uL (ref 4.40–5.90)
RDW: 17.1 % — AB (ref 11.5–14.5)
WBC: 15 10*3/uL — AB (ref 3.8–10.6)

## 2017-08-06 LAB — TROPONIN I: Troponin I: <0.03 ng/mL (ref <0.03)

## 2017-08-06 MED ORDER — AZITHROMYCIN 250 MG PO TABS: 250.0000 mg | ORAL_TABLET | Freq: Every day | ORAL | 0 refills | Status: AC

## 2017-08-06 MED ORDER — ALBUTEROL SULFATE (2.5 MG/3ML) 0.083% IN NEBU
5.0000 mg | INHALATION_SOLUTION | Freq: Once | RESPIRATORY_TRACT | Status: AC
  Administered 2017-08-06: 5 mg via RESPIRATORY_TRACT
  Filled 2017-08-06: qty 6

## 2017-08-06 MED ORDER — AZITHROMYCIN 500 MG PO TABS
500.0000 mg | ORAL_TABLET | Freq: Once | ORAL | Status: AC
  Administered 2017-08-06: 500 mg via ORAL
  Filled 2017-08-06: qty 1

## 2017-08-06 NOTE — ED Provider Notes (Addendum)
[facility] Emergency Department Provider Note  Time seen: 3:15 PM  I have reviewed the triage vital signs and the nursing notes.   HISTORY  Chief Complaint Shortness of Breath    HPI Stephen Brady is a 67 y.o. male with a past medical history of COPD, CAD, hypertension, presents to the emergency department for cough, congestion and low oxygen level.  According to the patient he had been feeling better over the past 1 week since his last emergency department visit.  He states last night his cough began to worsen productive of yellow/green sputum.  States he was coughing all night took his pulse ox at one point it was 69% per patient at home, he has oxygen to use as needed so he used his oxygen and felt much better.  States he continues to have cough with shortness of breath today so he came to the emergency department for evaluation.  Upon arrival the patient appears well, no distress.  Largely normal vitals with a low-grade temperature 99.3.  Patient states he has been feeling chills over the past 24 hours.  97% currently on room air.  Largely normal review of systems otherwise.  No chest pain.   Past Medical History:  Diagnosis Date  . Asthma   . COPD (chronic obstructive pulmonary disease) (HCC)   . Coronary artery disease   . Hypertension     Patient Active Problem List   Diagnosis Date Noted  . Influenza A 07/13/2017  . Acute respiratory distress 07/13/2017  . COPD exacerbation (HCC) 07/13/2017  . Chest pain, atypical 07/13/2017    Past Surgical History:  Procedure Laterality Date  . LEFT HEART CATH AND CORONARY ANGIOGRAPHY N/A 07/15/2017   Procedure: LEFT HEART CATH AND CORONARY ANGIOGRAPHY;  Surgeon: Lamar BlinksKowalski, Bruce J, MD;  Location: [facility] INVASIVE CV LAB;  Service: Cardiovascular;  Laterality: N/A;    Prior to Admission medications   Medication Sig Start Date End Date Taking? Authorizing Provider  acetaminophen (TYLENOL) 325 MG tablet Take 2 tablets  (650 mg total) by mouth every 6 (six) hours as needed for mild pain (or Fever >/= 101). 07/16/17   Altamese DillingVachhani, Vaibhavkumar, MD  albuterol (PROVENTIL HFA;VENTOLIN HFA) 108 (90 Base) MCG/ACT inhaler Inhale 2 puffs into the lungs every 6 (six) hours as needed for wheezing or shortness of breath. 07/29/17   Nita SickleVeronese, [place], MD  aspirin 325 MG tablet Take 325 mg by mouth daily.    [provider]  atorvastatin (LIPITOR) 10 MG tablet Take 10 mg by mouth daily.    [provider]  diltiazem (CARDIZEM) 30 MG tablet Take 30 mg by mouth 2 (two) times daily.    [provider]  ipratropium (ATROVENT HFA) 17 MCG/ACT inhaler Inhale 1 puff into the lungs 3 (three) times daily. 07/29/17 07/29/18  Nita SickleVeronese, [place], MD  isosorbide mononitrate (IMDUR) 30 MG 24 hr tablet Take 30 mg by mouth daily.    [provider]  lisinopril (PRINIVIL,ZESTRIL) 2.5 MG tablet Take 2.5 mg by mouth daily.    [provider]  nitroGLYCERIN (NITROSTAT) 0.4 MG SL tablet Place 0.4 mg under the tongue every 5 (five) minutes as needed for chest pain.    [provider]  pantoprazole (PROTONIX) 40 MG tablet Take 40 mg by mouth daily.    [provider]    Allergies  Allergen Reactions  . Penicillins Hives    Has patient had a PCN reaction causing immediate rash, facial/tongue/throat swelling, SOB or lightheadedness with hypotension:  Unknown Has patient had a PCN reaction causing severe rash involving mucus membranes or skin necrosis: Unknown Has patient had a PCN reaction that required hospitalization: Unknown Has patient had a PCN reaction occurring within the last 10 years: Yes If all of the above answers are "NO", then may proceed with Cephalosporin use.    No family history on file.  Social History Social History   Tobacco Use  . Smoking status: Former Smoker    Last attempt to quit: 07/13/2008    Years since quitting: 9.0  . Smokeless tobacco: Never Used  Substance  Use Topics  . Alcohol use: No    Frequency: Never  . Drug use: No    Review of Systems Constitutional: Subjective chills/fever. Eyes: Negative for visual complaints ENT: Negative for recent illness/congestion Cardiovascular: Negative for chest pain. Respiratory: Positive shortness of breath.  Positive for cough with yellow/green sputum. Gastrointestinal: Negative for abdominal pain, vomiting  Genitourinary: Negative for urinary compaints Musculoskeletal: Negative for leg pain or swelling. Skin: Negative for skin complaints  Neurological: Negative for headache All other ROS negative  ____________________________________________   PHYSICAL EXAM:  VITAL SIGNS: ED Triage Vitals  Enc Vitals Group     BP 08/06/17 1139 (!) 110/91     Pulse Rate 08/06/17 1139 82     Resp 08/06/17 1139 20     Temp 08/06/17 1139 99.3 F (37.4 C)     Temp Source 08/06/17 1139 Oral     SpO2 08/06/17 1139 96 %     Weight 08/06/17 1139 165 lb (74.8 kg)     Height 08/06/17 1139 5\' 7"  (1.702 m)     Head Circumference --      Peak Flow --      Pain Score 08/06/17 1146 4     Pain Loc --      Pain Edu? --      Excl. in GC? --    Constitutional: Alert and oriented. Well appearing and in no distress. Eyes: Normal exam ENT   Head: Normocephalic and atraumatic.   Mouth/Throat: Mucous membranes are moist. Cardiovascular: Normal rate, regular rhythm. No murmur Respiratory: Normal respiratory effort without tachypnea nor retractions.  Slight expiratory wheeze.  No rales or rhonchi. Gastrointestinal: Soft and nontender. No distention.   Musculoskeletal: Nontender with normal range of motion in all extremities. No lower extremity tenderness or edema. Neurologic:  Normal speech and language. No gross focal neurologic deficits are appreciated. Skin:  Skin is warm, dry and intact.  Psychiatric: Mood and affect are normal. Speech and behavior are normal.   ____________________________________________     EKG  EKG reviewed and interpreted by myself shows normal sinus rhythm 82 bpm with a narrow QRS, mild left axis deviation, normal intervals, no concerning ST changes.  ____________________________________________    RADIOLOGY  Chest x-ray shows no acute abnormality.  ____________________________________________   INITIAL IMPRESSION / ASSESSMENT AND PLAN / ED COURSE  Pertinent labs & imaging results that were available during my care of the patient were reviewed by me and considered in my medical decision making (see chart for details).  Presents to the emergency department for shortness of breath cough with green/yellow sputum.  Differential would include pneumonia, COPD exacerbation, ACS.  Patient had a recent admission approximately 1 month ago for influenza and COPD exacerbation.  At that time had a cardiac catheterization as well showing no acute concerning abnormalities.  Patient was seen in the emergency department proximal he 1 week ago for shortness of breath,  was discharged on prednisone.  States he has been doing much better until last night.  Currently the patient appears very well, labs have shown a white blood cell count of 15,000 but the patient just completed a course of prednisone.  Temperature 99.3 but reports subjective fever/chills at home.  Patient has a very very slight expiratory wheeze, no rales or rhonchi.  X-ray shows no acute change.  Patient had a CT angiography performed 1 week ago.  Symptoms are most consistent with acute bronchitis/COPD exacerbation.  I discussed with the patient to continue taking his home breathing treatments, use oxygen as needed especially at night however given his history I do believe he would benefit from a course of antibiotics for any possible bacterial infection.  Patient agreeable to this plan of care.  Plans on returning to [place] in the next several weeks and states he will follow-up in [place] with his pulmonary doctor.   I discussed the patient's CT angiography findings last week showing a nodule/mass that will need to be followed up by his pulmonologist with repeat CT scan and possible biopsy.  Patient agreeable to this plan of care.  I discussed return precautions for any worsening trouble breathing.  I provided the patient a printed report of his CT scan from last week and highlighted the concerning finding as well as follow-up recommendations.  Patient is to bring this to his pulmonologist.  Patient agreeable. ____________________________________________   FINAL CLINICAL IMPRESSION(S) / ED DIAGNOSES  Bronchitis COPD exacerbation    Minna Antis, MD 08/06/17 1519    Minna Antis, MD 08/06/17 1523

## 2017-08-06 NOTE — ED Notes (Signed)
Lab results and CXR reviewed. Awaiting room for MD eval.  

## 2017-08-06 NOTE — ED Notes (Signed)
Patient transported to X-ray 

## 2017-08-06 NOTE — ED Triage Notes (Signed)
Pt in via POV with complaints of increasing shortness of breath with productive cough x 2 days.  Pt with COPD, wears oxygen at night, reports last night when putting oxygen SpO2 65%.  Vitals WDL at this time.

## 2017-08-06 NOTE — ED Notes (Signed)
Pt discharged to home.  Family member driving.  Discharge instructions reviewed.  Verbalized understanding.  No questions or concerns at this time.  Teach back verified.  Pt in NAD.  No items left in ED.   

## 2017-08-06 NOTE — ED Triage Notes (Signed)
First nurse note: Patient to ER for c/o coughing up green and yellow sputum last couple days. Patient reports fever as well (unknown temp). Patient was recently hospitalized for PNA and had cardiac cath at same visit. Patient ambulatory to stat desk and speaking in complete sentences without difficulty.

## 2020-01-23 IMAGING — CT CT ANGIO CHEST
2 of 7 series · 18 of 36 positions shown · IV contrast (APPLIED)
Comparison: 07/13/2017

CLINICAL DATA: Recent hospitalization for pneumonia, heart
catheterization, hurting since catheterization, history of coronary
disease post MI, COPD, hypertension, smoker

EXAM:
CT ANGIOGRAPHY CHEST WITH CONTRAST
TECHNIQUE: Multidetector CT imaging of the chest was performed using the
standard protocol during bolus administration of intravenous
contrast. Multiplanar CT image reconstructions and MIPs were
obtained to evaluate the vascular anatomy.
CONTRAST:  75mL XNG2YU-WFR IOPAMIDOL (XNG2YU-WFR) INJECTION 76% IV

[Series 5: thins · axial · 0.71mm/px · z∈[+109,+388]mm · 15 of 319 slices shown]
[im 20/319  lung]
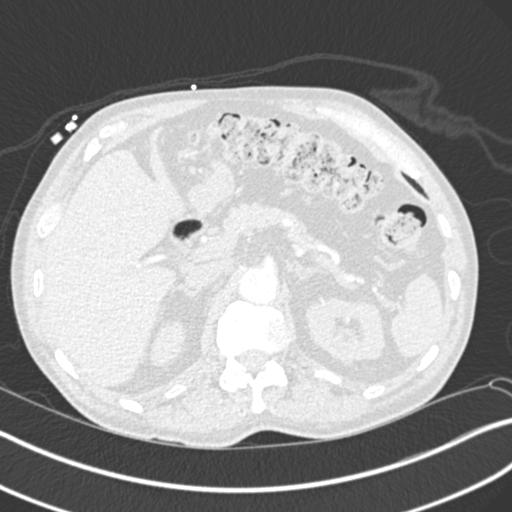
[im 40/319  mediastinal]
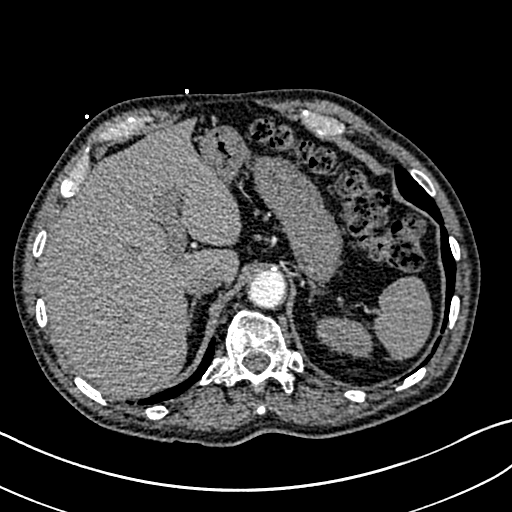
[im 60/319  lung]
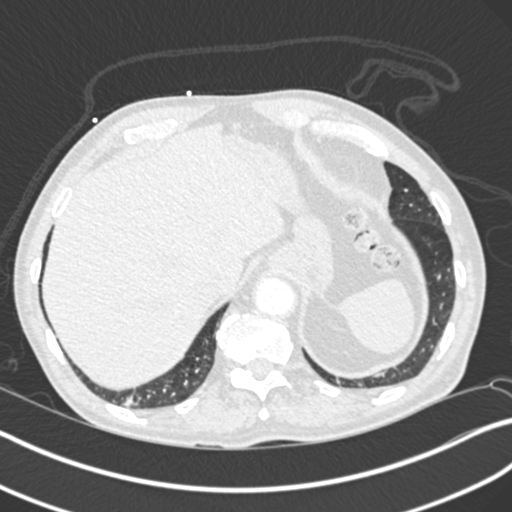
[im 80/319  mediastinal]
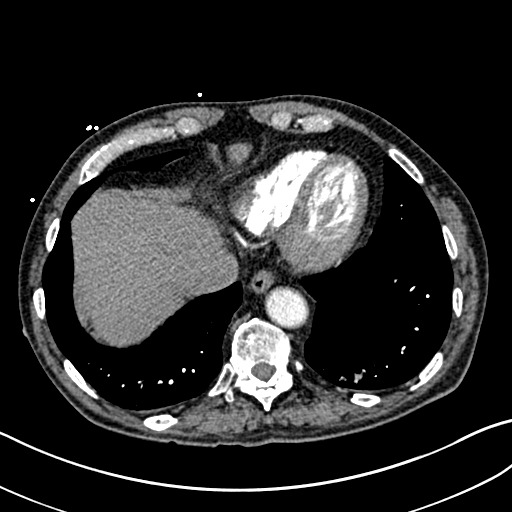
[im 100/319  lung]
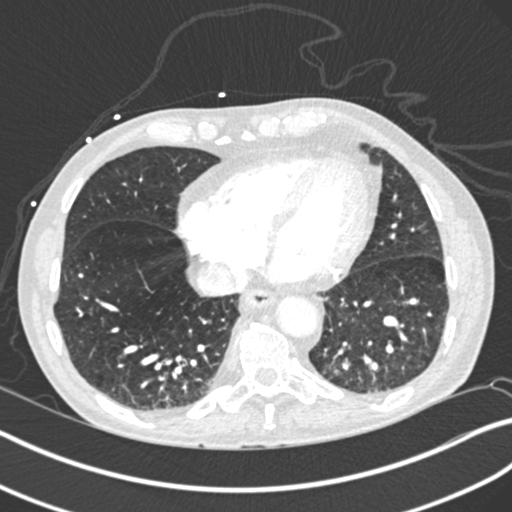
[im 120/319  mediastinal]
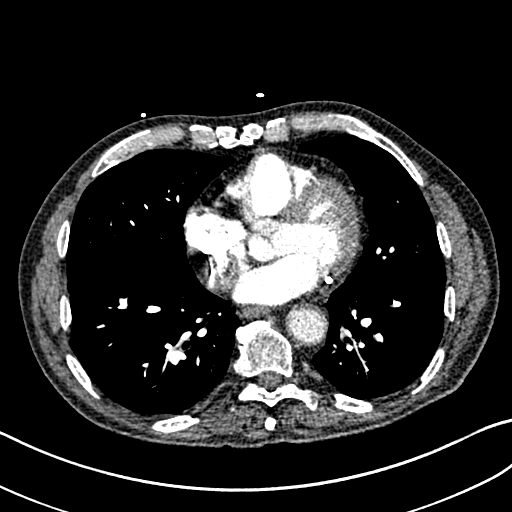
[im 140/319  lung]
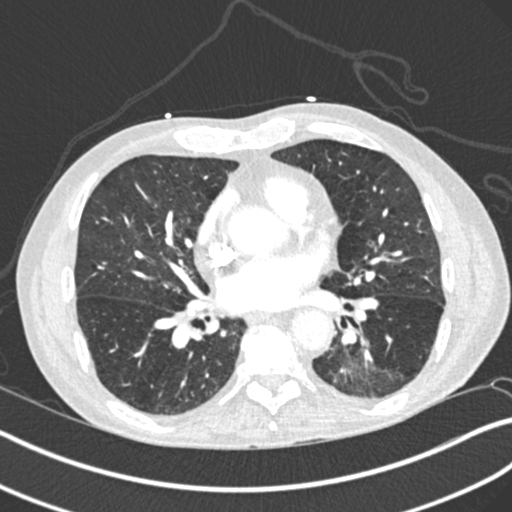
[im 160/319  mediastinal]
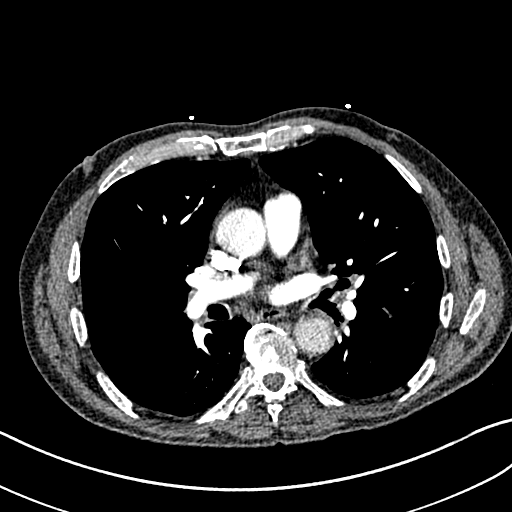
[im 179/319  lung]
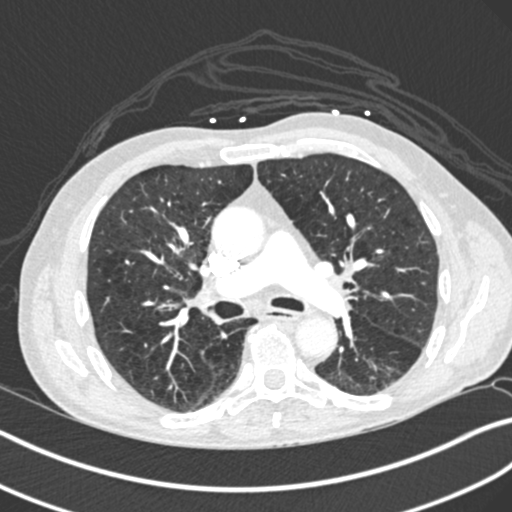
[im 199/319  mediastinal]
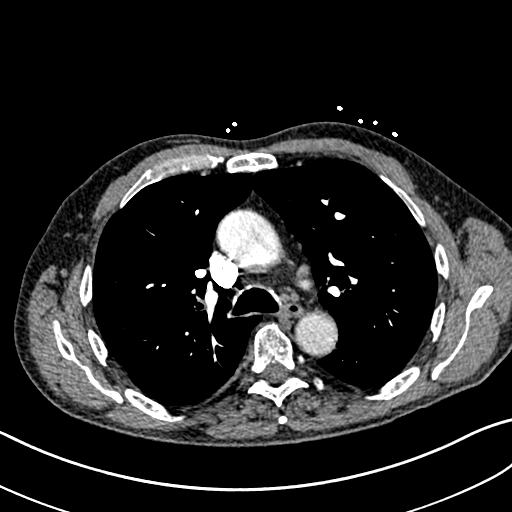
[im 219/319  lung]
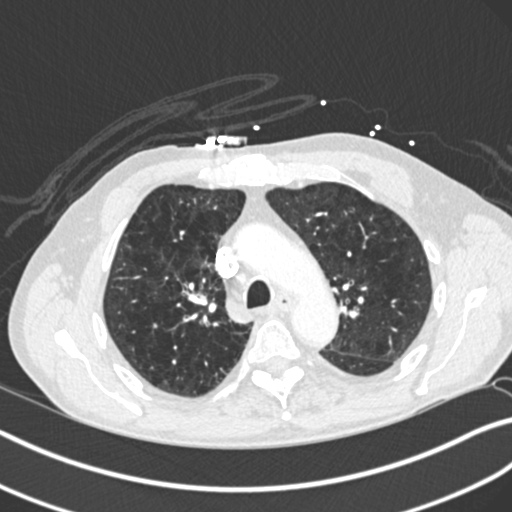
[im 239/319  mediastinal]
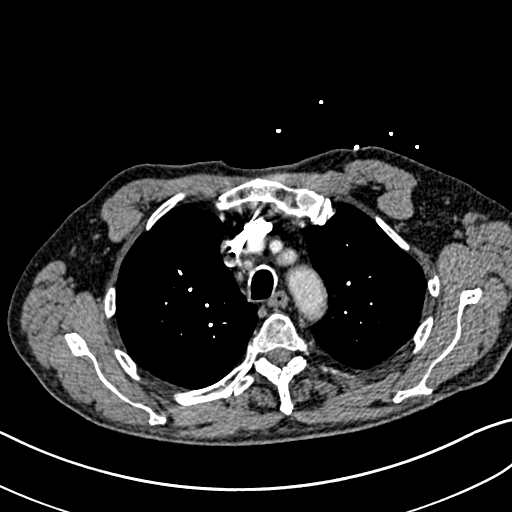
[im 259/319  lung]
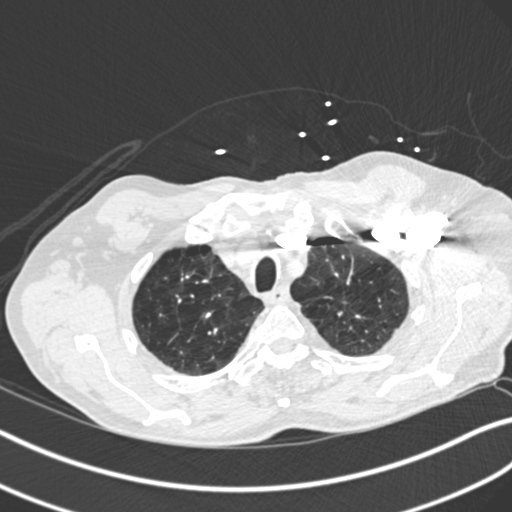
[im 279/319  mediastinal]
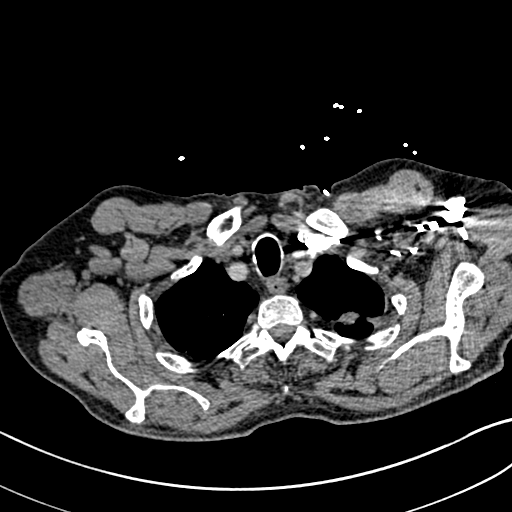
[im 299/319  lung]
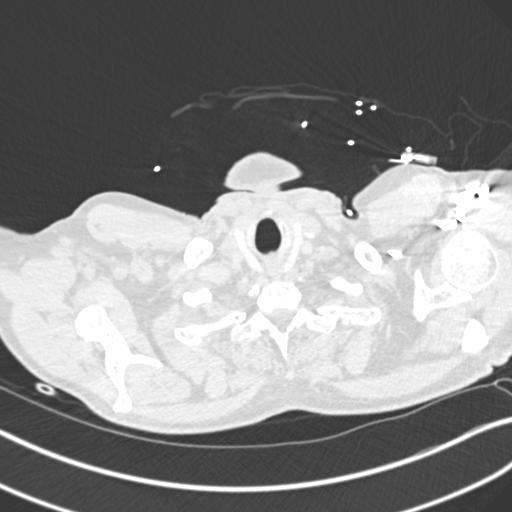

[Series 6: lung · axial · 0.71mm/px · z∈[+192,+336]mm · 3 of 96 slices shown]
[im 24/96  mediastinal]
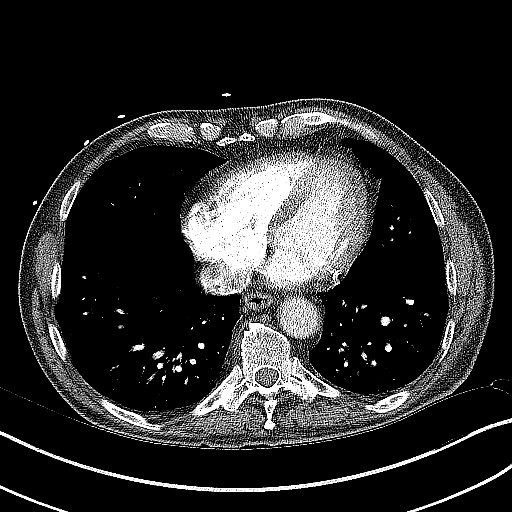
[im 48/96  mediastinal]
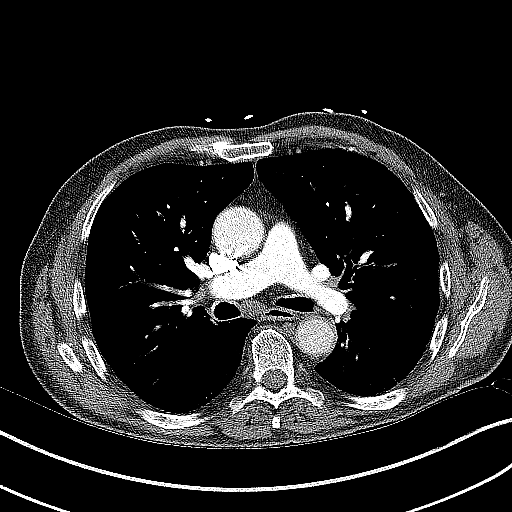
[im 72/96  mediastinal]
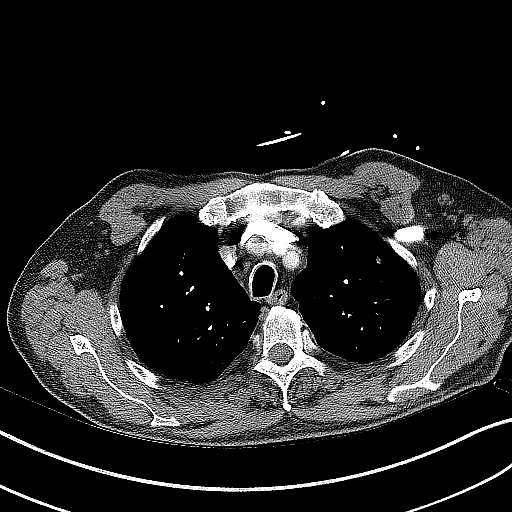

[18 of 36 positions shown; findings below may reference images not displayed]

FINDINGS: Cardiovascular: Atherosclerotic calcification aorta and coronary
arteries. Probable coronary stenting. Ascending thoracic aorta upper
normal caliber 3.8 cm diameter. No aortic aneurysm or dissection.
Pulmonary arteries well opacified and patent. No evidence of
pulmonary embolism. Heart unremarkable. No pericardial effusion.

Mediastinum/Nodes: Esophagus normal appearance. Base of cervical
region unremarkable. No thoracic adenopathy.

Lungs/Pleura: Emphysematous changes with minimal biapical scarring.
Stellate density 8 mm diameter LEFT upper lobe unchanged. Linear
subsegmental atelectasis in lower lobes. No acute infiltrate,
pleural effusion, or pneumothorax.

Upper Abdomen: Dependent calcified gallstone in gallbladder.
Remaining visualized upper abdomen unremarkable for technique

Musculoskeletal: No acute osseous findings.

Review of the MIP images confirms the above findings.
IMPRESSION: No evidence of pulmonary embolism.

Cholelithiasis.

8 mm stellate density in the LEFT upper lobe, unchanged;
recommendation below.

Non-contrast chest CT at 6-12 months is recommended. If the nodule
is stable at time of repeat CT, then future CT at 18-24 months (from
today's scan) is considered optional for low-risk patients, but is
recommended for high-risk patients. This recommendation follows the
consensus statement: Guidelines for Management of Incidental
Pulmonary Nodules Detected on CT Images: From the [HOSPITAL]

Aortic Atherosclerosis (ZFMUA-ZLF.F) and Emphysema ([id number]-MTG.K).
